# Patient Record
Sex: Female | Born: 1960
Health system: Southern US, Community
[De-identification: ages and names within clinical notes are randomized; demographics above are authoritative.]

## PROBLEM LIST (undated history)

## (undated) DIAGNOSIS — T7840XA Allergy, unspecified, initial encounter: Secondary | ICD-10-CM

## (undated) HISTORY — DX: Allergy, unspecified, initial encounter: T78.40XA

---

## 1999-09-11 ENCOUNTER — Other Ambulatory Visit: Admission: RE | Admit: 1999-09-11 | Discharge: 1999-09-11 | Payer: Self-pay | Admitting: *Deleted

## 1999-11-15 ENCOUNTER — Other Ambulatory Visit: Admission: RE | Admit: 1999-11-15 | Discharge: 1999-11-15 | Payer: Self-pay | Admitting: *Deleted

## 1999-11-15 ENCOUNTER — Encounter (INDEPENDENT_AMBULATORY_CARE_PROVIDER_SITE_OTHER): Payer: Self-pay | Admitting: Specialist

## 2001-02-02 ENCOUNTER — Other Ambulatory Visit: Admission: RE | Admit: 2001-02-02 | Discharge: 2001-02-02 | Payer: Self-pay | Admitting: Gynecology

## 2001-02-02 ENCOUNTER — Other Ambulatory Visit: Admission: RE | Admit: 2001-02-02 | Discharge: 2001-02-02 | Payer: Self-pay | Admitting: Obstetrics and Gynecology

## 2001-09-05 ENCOUNTER — Inpatient Hospital Stay (HOSPITAL_COMMUNITY): Admission: AD | Admit: 2001-09-05 | Discharge: 2001-09-09 | Payer: Self-pay | Admitting: *Deleted

## 2001-09-05 ENCOUNTER — Encounter (INDEPENDENT_AMBULATORY_CARE_PROVIDER_SITE_OTHER): Payer: Self-pay

## 2004-06-05 ENCOUNTER — Other Ambulatory Visit: Admission: RE | Admit: 2004-06-05 | Discharge: 2004-06-05 | Payer: Self-pay | Admitting: Obstetrics and Gynecology

## 2009-02-08 ENCOUNTER — Encounter (INDEPENDENT_AMBULATORY_CARE_PROVIDER_SITE_OTHER): Payer: Self-pay | Admitting: Interventional Radiology

## 2009-02-08 ENCOUNTER — Other Ambulatory Visit: Admission: RE | Admit: 2009-02-08 | Discharge: 2009-02-08 | Payer: Self-pay | Admitting: Interventional Radiology

## 2009-02-08 ENCOUNTER — Encounter: Admission: RE | Admit: 2009-02-08 | Discharge: 2009-02-08 | Payer: Self-pay | Admitting: Internal Medicine

## 2010-07-23 ENCOUNTER — Encounter
Admission: RE | Admit: 2010-07-23 | Discharge: 2010-07-23 | Payer: Self-pay | Source: Home / Self Care | Attending: Obstetrics & Gynecology | Admitting: Obstetrics & Gynecology

## 2010-12-21 NOTE — Discharge Summary (Signed)
[facility]  Patient:    Elizabeth Dennis, Elizabeth Dennis Visit Number: [id number] MRN: [id number]          Service Type: OBS Location: 910A 9146 01 Attending Physician:  Osborn Coho Dictated by:   Miguel Aschoff, M.D. Admit Date:  09/05/2001 Discharge Date: 09/09/2001                             Discharge Summary  ADMISSION DIAGNOSIS:          Intrauterine pregnancy at term.  FINAL DIAGNOSES:              1. Intrauterine pregnancy at term.                               2. Arrest of labor with failure to progress.                               3. Desired permanent sterilization.  OPERATIONS AND PROCEDURES:    1. Primary low flap transverse cesarean section.                               2. Bilateral Pomeroy tubal sterilization.                               3. Epidural anesthesia.  BRIEF HISTORY:                The patient is a 50 year old white female, gravida 2, para 1-0-0-1, with an estimated date of confinement of September 17, 2001. The patient entered into labor and had an arrest of dilatation at 7 cm. HOSPITAL COURSE:              Due to the arrest of labor, she was taken for primary cesarean section and this was carried out without difficulty and yielded a viable female infant weighing 7 pounds 13 ounces. In addition at the time of surgery, the patient underwent a bilateral tubal sterilization. The Apgar score on the infant was 7 at one minute and 9 at five minutes.  The patient had an essentially uncomplicated postoperative course. She tolerated increasing ambulation and diet well. Her hemoglobin did drop to 7.7.  DISCHARGE MEDICATIONS:        1. Tylox one every three hours as needed for                                  pain.                               2. Chromagen iron capsules one daily.  DISCHARGE INSTRUCTIONS:       The patient was instructed to do no heavy lifting and place nothing in the vagina for four weeks.  DISCHARGE FOLLOWUP:            The patient is to return to the office for followup examination in five to six weeks. Dictated by:   Miguel Aschoff, M.D.  Attending Physician:  Osborn Coho DD:  10/09/01 TD:  10/12/01 Job: [id number]

## 2010-12-21 NOTE — Op Note (Signed)
[facility]  Patient:    Elizabeth Dennis, Elizabeth Dennis Visit Number: [id number] MRN: [id number]          Service Type: OBS Location: 910A 9146 01 Attending Physician:  Osborn Coho Dictated by:   Janeece Riggers [name], M.D. Proc. Date: 09/06/01 Admit Date:  09/05/2001                             Operative Report  PREOPERATIVE DIAGNOSES:       1. Intrauterine pregnancy at term.                               2. Failure to progress.                               3. Patient desires permanent sterilization.  POSTOPERATIVE DIAGNOSES:      1. Intrauterine pregnancy at term.                               2. Failure to progress.                               3. Patient desires permanent sterilization.  PROCEDURE:                    1. Primary low transverse cesarean section.                               2. Bilateral tubal ligation, Pomeroy procedure.  SURGEON:                      Elizabeth Dennis, M.D.  ANESTHESIA:                   Epidural.  ANTIBIOTICS:                  Cleocin 900 mg.  COMPLICATIONS:                None.  ESTIMATED BLOOD LOSS:         900 cc.  SPECIMENS:                    Right and left portion of fallopian tube sent to pathology.  FINDINGS:                     Patient had normal fallopian tubes and ovaries bilaterally.  The uterus was noted to have several fibroids.  The largest was approximately 4-5 cm in diameter.  This was on the left fundus.  It was subserosal.  Placenta appeared to be normal.  Was not sent to pathology.  The patient delivered one live viable white female infant weighing 7 pounds and 13 ounces.  INDICATIONS:                  Ms. Elizabeth Dennis is a 50 year old white female G2, P1 who presented to labor and delivery on September 05, 2001 complaining of contractions with spontaneous rupture of membranes.  Patients pregnancy was complicated by advanced maternal age.  She refused an amniocentesis.  Patient also has group B  Strep  with allergy to penicillin.  On admission the patient was 1 cm dilated.  Patient was begun on Pitocin augmentation.  She had a very protracted labor curve.  When she reached 6 cm she had an episode of deep, late decelerations which resolved spontaneously.  The patient progressed to 7 cm and failed to change her cervix over the next three hours.  The patient was taken to the operating room for a primary cesarean section and bilateral tubal ligation.  The patient expressed her understanding of the intended permanence and possible failure rate of this procedure.  PROCEDURE:                     Patient was taken to the operating room where she was placed in the dorsal supine position with a left lateral tilt.  Her epidural anesthetic was adjusted.  Once an adequate level was met she was prepped with Betadine and draped in the usual fashion for this procedure. Pfannenstiel incision was made.  This was carried down to the fascia.  The fascia was entered in the midline, extended laterally.  The rectus muscles were divided from the fascia with the Bovie.  Rectus muscles were divided in the midline and taken superiorly and inferiorly.  The parietoperitoneum was entered bluntly.  The bladder flap was taken down sharply.  A low transverse uterine incision was made in the midline and extended laterally with blunt dissection.  On entering the uterine cavity terminal meconium was noted.  The infant was delivered in the vertex presentation, DeLee and bulb suctioned. The remaining infant was then delivered and cord doubly clamped and cut.  The infant was handed to the awaiting NICU team.  No meconium was noted in blood or cords.  The placenta was then manually removed.  The uterine cavity was wiped with a wet lap.  The uterine incision was closed in a single layer of 0 chromic in a running locking fashion.  The bladder flap was not closed.  The right fallopian tube was then grasped in the isthmic  portion, doubly ligated with 0 gut suture.  A 2 cm knuckle was then excised.  Ostia were visualized. Hemostasis was adequate.  A similar procedure was performed on the opposite side.  At this point the fascia was closed using 0 Monocryl suture in a running fashion.  Subcuticular tissue was made hemostatic with the Bovie. Stainless steel clips were used to close the skin.  Patient tolerated procedure well.  She was taken to recovery room in stable condition. Instrument and lap counts were correct x2. Dictated by:   Janeece Riggers [name], M.D. Attending Physician:  Osborn Coho DD:  09/06/01 TD:  09/07/01 Job: [id number]

## 2014-02-18 ENCOUNTER — Other Ambulatory Visit: Payer: Self-pay | Admitting: Dermatology

## 2015-12-07 DIAGNOSIS — M25522 Pain in left elbow: Secondary | ICD-10-CM | POA: Diagnosis not present

## 2015-12-07 DIAGNOSIS — M67922 Unspecified disorder of synovium and tendon, left upper arm: Secondary | ICD-10-CM | POA: Diagnosis not present

## 2015-12-07 DIAGNOSIS — M7702 Medial epicondylitis, left elbow: Secondary | ICD-10-CM | POA: Diagnosis not present

## 2015-12-07 DIAGNOSIS — M7712 Lateral epicondylitis, left elbow: Secondary | ICD-10-CM | POA: Diagnosis not present

## 2016-01-17 DIAGNOSIS — G8929 Other chronic pain: Secondary | ICD-10-CM | POA: Diagnosis not present

## 2016-01-17 DIAGNOSIS — M25522 Pain in left elbow: Secondary | ICD-10-CM | POA: Diagnosis not present

## 2016-01-23 ENCOUNTER — Encounter: Payer: Self-pay | Admitting: Internal Medicine

## 2016-01-23 ENCOUNTER — Ambulatory Visit (INDEPENDENT_AMBULATORY_CARE_PROVIDER_SITE_OTHER): Payer: 59 | Admitting: Internal Medicine

## 2016-01-23 VITALS — BP 108/78 | HR 97 | Ht 65.0 in | Wt 179.0 lb

## 2016-01-23 DIAGNOSIS — R1310 Dysphagia, unspecified: Secondary | ICD-10-CM

## 2016-01-23 DIAGNOSIS — K219 Gastro-esophageal reflux disease without esophagitis: Secondary | ICD-10-CM | POA: Diagnosis not present

## 2016-01-23 DIAGNOSIS — Z1211 Encounter for screening for malignant neoplasm of colon: Secondary | ICD-10-CM | POA: Diagnosis not present

## 2016-01-23 NOTE — Progress Notes (Signed)
HISTORY OF PRESENT ILLNESS:  Elizabeth Dennis is a 55 y.o. female , cardiology billing employee, who is sent by her primary care physician Dr. Jonni Sanger with chief complaints of GERD, dysphagia, and the need for colon cancer screening. Patient reports having developed discomfort in her elbow for which she took NSAIDs. Thereafter developed pyrosis or regurgitation. She has gained 15 pounds over the past year. She also reports intermittent solid food dysphagia for approximate 1 year. No other abdominal complaints. No family history of colon cancer. She does use alcohol but has never smoked. She is interested in colon cancer screening. Her daughter is a Designer, jewellery  REVIEW OF SYSTEMS:  All non-GI ROS negative upon comprehensive review  History reviewed. No pertinent past medical history.  History reviewed. No pertinent past surgical history.  Social History Elizabeth Dennis  reports that she has never smoked. She has never used smokeless tobacco. She reports that she does not drink alcohol or use illicit drugs.  family history includes Breast cancer in her mother; COPD in her father and mother; Cervical cancer in her mother; Hypertension in her mother.  Allergies  Allergen Reactions  . Penicillins Hives    Childhood - ? Hives       PHYSICAL EXAMINATION: Vital signs: BP 108/78 mmHg  Pulse 97  Ht 5\' 5"  (1.651 m)  Wt 179 lb (81.194 kg)  BMI 29.79 kg/m2  Constitutional: generally well-appearing, no acute distress Psychiatric: alert and oriented x3, cooperative Eyes: extraocular movements intact, anicteric, conjunctiva pink Mouth: oral pharynx moist, no lesions Neck: supple Without thyromegaly Lymph: no lymphadenopathy Cardiovascular: heart regular rate and rhythm, no murmur Lungs: clear to auscultation bilaterally Abdomen: soft, nontender, nondistended, no obvious ascites, no peritoneal signs, normal bowel sounds, no organomegaly Rectal:Deferred until colonoscopy. Extremities: no  clubbing cyanosis or lower extremity edema bilaterally Skin: no lesions on visible extremities Neuro: No focal deficits. Cranial nerves intact. Normal DTRs  ASSESSMENT:  #1. GERD #2. Intermittent solid food dysphagia. Rule out peptic stricture #3. Colon cancer screening. Baseline risk   PLAN:  #1. Reflux precautions with attention to weight loss. Literature provided on GERD and reflux precautions #2. Prilosec OTC 20 mg daily #3. Schedule upper endoscopy with probable esophageal dilation.The nature of the procedure, as well as the risks, benefits, and alternatives were carefully and thoroughly reviewed with the patient. Ample time for discussion and questions allowed. The patient understood, was satisfied, and agreed to proceed. #4. Schedule screening colonoscopy.The nature of the procedure, as well as the risks, benefits, and alternatives were carefully and thoroughly reviewed with the patient. Ample time for discussion and questions allowed. The patient understood, was satisfied, and agreed to proceed.  A copy of this consultation note has been sent to Dr. Jonni Sanger

## 2016-01-23 NOTE — Patient Instructions (Signed)
You may call in the next few months to schedule your colonoscopy  You may take Prilosec over the counter for your reflux symptoms

## 2016-02-14 DIAGNOSIS — M7712 Lateral epicondylitis, left elbow: Secondary | ICD-10-CM | POA: Diagnosis not present

## 2016-02-14 DIAGNOSIS — M7702 Medial epicondylitis, left elbow: Secondary | ICD-10-CM | POA: Diagnosis not present

## 2016-02-14 DIAGNOSIS — M67922 Unspecified disorder of synovium and tendon, left upper arm: Secondary | ICD-10-CM | POA: Diagnosis not present

## 2016-03-22 DIAGNOSIS — D2271 Melanocytic nevi of right lower limb, including hip: Secondary | ICD-10-CM | POA: Diagnosis not present

## 2016-03-22 DIAGNOSIS — L821 Other seborrheic keratosis: Secondary | ICD-10-CM | POA: Diagnosis not present

## 2016-03-22 DIAGNOSIS — Z85828 Personal history of other malignant neoplasm of skin: Secondary | ICD-10-CM | POA: Diagnosis not present

## 2016-03-22 DIAGNOSIS — D225 Melanocytic nevi of trunk: Secondary | ICD-10-CM | POA: Diagnosis not present

## 2016-03-22 DIAGNOSIS — D2371 Other benign neoplasm of skin of right lower limb, including hip: Secondary | ICD-10-CM | POA: Diagnosis not present

## 2016-03-22 DIAGNOSIS — D1801 Hemangioma of skin and subcutaneous tissue: Secondary | ICD-10-CM | POA: Diagnosis not present

## 2016-03-22 DIAGNOSIS — L814 Other melanin hyperpigmentation: Secondary | ICD-10-CM | POA: Diagnosis not present

## 2016-05-31 DIAGNOSIS — Z803 Family history of malignant neoplasm of breast: Secondary | ICD-10-CM | POA: Diagnosis not present

## 2016-05-31 DIAGNOSIS — E669 Obesity, unspecified: Secondary | ICD-10-CM | POA: Diagnosis not present

## 2016-07-04 DIAGNOSIS — R3915 Urgency of urination: Secondary | ICD-10-CM | POA: Diagnosis not present

## 2016-07-04 DIAGNOSIS — R109 Unspecified abdominal pain: Secondary | ICD-10-CM | POA: Diagnosis not present

## 2016-07-04 DIAGNOSIS — R110 Nausea: Secondary | ICD-10-CM | POA: Diagnosis not present

## 2016-07-28 ENCOUNTER — Encounter (HOSPITAL_BASED_OUTPATIENT_CLINIC_OR_DEPARTMENT_OTHER): Payer: Self-pay | Admitting: *Deleted

## 2016-07-28 ENCOUNTER — Emergency Department (HOSPITAL_BASED_OUTPATIENT_CLINIC_OR_DEPARTMENT_OTHER)
Admission: EM | Admit: 2016-07-28 | Discharge: 2016-07-28 | Disposition: A | Discharge status: Home / Self Care | Payer: 59 | Attending: Emergency Medicine | Admitting: Emergency Medicine

## 2016-07-28 DIAGNOSIS — M5441 Lumbago with sciatica, right side: Secondary | ICD-10-CM | POA: Insufficient documentation

## 2016-07-28 DIAGNOSIS — M5431 Sciatica, right side: Secondary | ICD-10-CM | POA: Diagnosis not present

## 2016-07-28 DIAGNOSIS — M545 Low back pain: Secondary | ICD-10-CM | POA: Diagnosis present

## 2016-07-28 MED ORDER — METHOCARBAMOL 500 MG PO TABS: 500.0000 mg | ORAL_TABLET | Freq: Two times a day (BID) | ORAL | 0 refills | Status: DC

## 2016-07-28 MED ORDER — METHOCARBAMOL 500 MG PO TABS
1000.0000 mg | ORAL_TABLET | Freq: Once | ORAL | Status: AC
  Administered 2016-07-28: 1000 mg via ORAL
  Filled 2016-07-28: qty 2

## 2016-07-28 MED ORDER — HYDROCODONE-ACETAMINOPHEN 5-325 MG PO TABS
2.0000 | ORAL_TABLET | Freq: Once | ORAL | Status: AC
  Administered 2016-07-28: 2 via ORAL
  Filled 2016-07-28: qty 2

## 2016-07-28 MED ORDER — HYDROCODONE-ACETAMINOPHEN 5-325 MG PO TABS: 1.0000 | ORAL_TABLET | Freq: Four times a day (QID) | ORAL | 0 refills | Status: DC | PRN

## 2016-07-28 MED ORDER — LIDOCAINE 5 % EX PTCH: 1.0000 | MEDICATED_PATCH | CUTANEOUS | 0 refills | Status: DC

## 2016-07-28 NOTE — ED Triage Notes (Signed)
She was seen at [facility] priority care this afternoon. She was given Prednisone, Toradol and Flexeril. She was diagnosed with sciatica and right leg pain. Here tonight with numbness and tightness in her right leg.

## 2016-07-28 NOTE — ED Provider Notes (Signed)
[facility] DEPT Provider Note   CSN: [id number] Arrival date & time: 07/28/16  2122  By signing my name below, I, Higinio Plan, attest that this documentation has been prepared under the direction and in the presence of non-physician practitioner, Arlean Hopping, PA-C. Electronically Signed: Higinio Plan, Scribe. 07/28/2016. 10:51 PM.  History   Chief Complaint Chief Complaint  Patient presents with  . Leg Pain   The history is provided by the patient. No language interpreter was used.   HPI Comments: Elizabeth Dennis is a 55 y.o. female who presents to the Emergency Department complaining of Pain radiating from her right lower back down her right leg beginning this morning. She reports associated muscle spasms. Patient states that she spent the morning squatting and sitting on the floor playing with her grandchild for a long period of time. Pain is moderate, but becomes severe with the muscle spasms. She was seen at an urgent care facility this afternoon and given prescriptions for methylprednisolone and Flexeril. Patient voices no relief with these medications. Patient also received an injection of Toradol at that time with no relief. Patient denies trauma, neuro deficits, changes in bowel or bladder function, or any other complaints.  History reviewed. No pertinent past medical history.  There are no active problems to display for this patient.  History reviewed. No pertinent surgical history.  OB History    No data available     Home Medications    Prior to Admission medications   Medication Sig Start Date End Date Taking? Authorizing Provider  HYDROcodone-acetaminophen (NORCO/VICODIN) 5-325 MG tablet Take 1-2 tablets by mouth every 6 (six) hours as needed. 07/28/16   Adib Wahba C Victorine Mcnee, PA-C  lidocaine (LIDODERM) 5 % Place 1 patch onto the skin daily. Remove & Discard patch within 12 hours or as directed by MD 07/28/16   Lorayne Bender, PA-C  methocarbamol (ROBAXIN) 500 MG tablet Take 1  tablet (500 mg total) by mouth 2 (two) times daily. 07/28/16   Lakeidra Reliford C Shakeeta Godette, PA-C  PENNSAID 2 % SOLN APPLY 1-2 Pump on skin two times daily 01/06/16   Historical Provider, MD  phentermine 37.5 MG capsule Take by mouth. 01/05/16 02/04/16  Historical Provider, MD  VIMOVO 500-20 MG TBEC Take 1 tablet by mouth 2 (two) times daily. 01/06/16   Historical Provider, MD   Family History Family History  Problem Relation Age of Onset  . Breast cancer Mother   . Cervical cancer Mother   . COPD Mother   . Hypertension Mother   . COPD Father    Social History Social History  Substance Use Topics  . Smoking status: Never Smoker  . Smokeless tobacco: Never Used  . Alcohol use No   Allergies   Penicillins  Review of Systems Review of Systems  Genitourinary: Negative for difficulty urinating.  Musculoskeletal: Positive for back pain and myalgias.  Neurological: Negative for weakness and numbness.   Physical Exam Updated Vital Signs BP 131/79   Pulse 92   Temp 98.2 F (36.8 C) (Oral)   Resp 16   Ht 5\' 5"  (1.651 m)   Wt 170 lb (77.1 kg)   SpO2 97%   BMI 28.29 kg/m   Physical Exam  Constitutional: She appears well-developed and well-nourished. No distress.  HENT:  Head: Normocephalic and atraumatic.  Eyes: Conjunctivae are normal.  Neck: Neck supple.  Cardiovascular: Normal rate, regular rhythm and intact distal pulses.   Pulmonary/Chest: Effort normal.  Musculoskeletal: She exhibits tenderness.  Tenderness to right  lower back into right buttocks as well as tenderness to the right upper leg. Normal motor function intact in all extremities and spine. No midline spinal tenderness.   Neurological: She is alert.  No sensory deficits. Strength 5/5 in both lower extremities. Slow, deliberate gait, but patient is ambulatory without assistance. Coordination intact.   Skin: Skin is warm and dry. She is not diaphoretic.  Psychiatric: She has a normal mood and affect. Her behavior is normal.  Nursing  note and vitals reviewed.  ED Treatments / Results  Labs (all labs ordered are listed, but only abnormal results are displayed) Labs Reviewed - No data to display  EKG  EKG Interpretation None       Radiology No results found.  Procedures Procedures (including critical care time)  Medications Ordered in ED Medications  methocarbamol (ROBAXIN) tablet 1,000 mg (1,000 mg Oral Given 07/28/16 2254)  HYDROcodone-acetaminophen (NORCO/VICODIN) 5-325 MG per tablet 2 tablet (2 tablets Oral Given 07/28/16 2254)    DIAGNOSTIC STUDIES:  Oxygen Saturation is 97% on RA, normal by my interpretation.    COORDINATION OF CARE:  10:43 PM Discussed treatment plan with pt at bedside and pt agreed to plan.  Initial Impression / Assessment and Plan / ED Course  I have reviewed the triage vital signs and the nursing notes.  Pertinent labs & imaging results that were available during my care of the patient were reviewed by me and considered in my medical decision making (see chart for details).  Clinical Course    Patient presents with symptoms of sciatica. No red flag symptoms. No neuro deficits.The patient was given instructions for home care as well as return precautions. Patient voices understanding of these instructions, accepts the plan, and is comfortable with discharge.   I personally performed the services described in this documentation, which was scribed in my presence. The recorded information has been reviewed and is accurate.  Final Clinical Impressions(s) / ED Diagnoses   Final diagnoses:  Sciatica of right side    New Prescriptions Discharge Medication List as of 07/28/2016 11:08 PM    START taking these medications   Details  HYDROcodone-acetaminophen (NORCO/VICODIN) 5-325 MG tablet Take 1-2 tablets by mouth every 6 (six) hours as needed., Starting Sun 07/28/2016, Print    lidocaine (LIDODERM) 5 % Place 1 patch onto the skin daily. Remove & Discard patch within 12  hours or as directed by MD, Starting Sun 07/28/2016, Print    methocarbamol (ROBAXIN) 500 MG tablet Take 1 tablet (500 mg total) by mouth 2 (two) times daily., Starting Sun 07/28/2016, Print         Lorayne Bender, PA-C 07/30/16 0046    Fatima Blank, MD 07/30/16 1154

## 2016-07-28 NOTE — Discharge Instructions (Signed)
Expect your soreness to increase over the next 2-3 days. Take it easy, but do not lay around too much as this may make any stiffness worse. Take 500 mg of naproxen every 12 hours or 800 mg of ibuprofen every 8 hours for the next 3 days. Take these medications with food to avoid upset stomach. Robaxin is a muscle relaxer and may help loosen stiff muscles. Do not take the Robaxin while driving or performing other dangerous activities. Be sure to perform the attached exercises starting with three times a week and working up to performing them daily. This is an essential part of preventing long term problems. Follow up with a primary care provider for any future management of these complaints.

## 2016-07-31 DIAGNOSIS — R300 Dysuria: Secondary | ICD-10-CM | POA: Diagnosis not present

## 2016-07-31 DIAGNOSIS — N3090 Cystitis, unspecified without hematuria: Secondary | ICD-10-CM | POA: Diagnosis not present

## 2016-07-31 DIAGNOSIS — M5431 Sciatica, right side: Secondary | ICD-10-CM | POA: Diagnosis not present

## 2016-07-31 DIAGNOSIS — R531 Weakness: Secondary | ICD-10-CM | POA: Diagnosis not present

## 2016-07-31 DIAGNOSIS — R200 Anesthesia of skin: Secondary | ICD-10-CM | POA: Diagnosis not present

## 2016-08-06 DIAGNOSIS — M5116 Intervertebral disc disorders with radiculopathy, lumbar region: Secondary | ICD-10-CM | POA: Diagnosis not present

## 2016-08-06 DIAGNOSIS — M4727 Other spondylosis with radiculopathy, lumbosacral region: Secondary | ICD-10-CM | POA: Diagnosis not present

## 2016-08-06 DIAGNOSIS — M5117 Intervertebral disc disorders with radiculopathy, lumbosacral region: Secondary | ICD-10-CM | POA: Diagnosis not present

## 2016-08-06 DIAGNOSIS — M5431 Sciatica, right side: Secondary | ICD-10-CM | POA: Diagnosis not present

## 2016-08-06 DIAGNOSIS — M4726 Other spondylosis with radiculopathy, lumbar region: Secondary | ICD-10-CM | POA: Diagnosis not present

## 2016-08-06 DIAGNOSIS — M438X6 Other specified deforming dorsopathies, lumbar region: Secondary | ICD-10-CM | POA: Diagnosis not present

## 2016-08-09 DIAGNOSIS — M79604 Pain in right leg: Secondary | ICD-10-CM | POA: Diagnosis not present

## 2016-08-12 DIAGNOSIS — M79604 Pain in right leg: Secondary | ICD-10-CM | POA: Diagnosis not present

## 2016-08-26 DIAGNOSIS — G5722 Lesion of femoral nerve, left lower limb: Secondary | ICD-10-CM | POA: Diagnosis not present

## 2016-09-09 DIAGNOSIS — G5722 Lesion of femoral nerve, left lower limb: Secondary | ICD-10-CM | POA: Diagnosis not present

## 2016-12-25 MED FILL — AZITHROMYCIN 250 MG TAB: 250 | 5 days supply | Qty: 6 | Fill #0

## 2017-04-01 NOTE — Progress Notes (Signed)
[facility] at [facility] [address], [place], [place] [address] [phone]  Date:  04/03/2017   Name:  Elizabeth Dennis   DOB:  11/11/1960   MRN:  [id number]  PCP:  Darreld Mclean, MD    Chief Complaint: Establish Care   History of Present Illness:  Elizabeth Dennis is a 56 y.o. very pleasant female patient who presents with the following:  Here today as a new patient to establish care She is not new to town- had been a [place] pt Reviewed her last CPE note from October 2016- she had her last pap and mammogram at that time. She is generally in good health  Tdap 08/06/2010   She lives very near to this practice location, and her old doctor left her former Network engineer .  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  She works in billing  She notes that she is overweight, but otherwise besides her leg issues described below she has been generally healthy  She had a bad attack of sciatica over the holidays- she had an MRI, saw neurosurgery- she does not need any operation.  Dr. Ronnald Ramp with [facility] evaluated her   She felt that gabapentin did help, but she did not like the way it made her feel so she stopped taking it   MRI done 08/06/16- 1. Small diffuse disc bulge at L4-L5 eccentric to the left which abuts the exiting left L4 nerve within the distal foramen. Small broad-based left paracentral protrusion at L5-S1 which abuts but does not significantly displace the descending left S1  nerve root. Correlate with radicular symptoms. 2. Mild levocurvature centered at L2-L3. 3. Small T2 hyperintense lesion within the left kidney, T2 hyperintense lesion within liver, these lesions are not fully evaluated, suggest dedicated abdominal ultrasound.  She still has some symptoms in her right leg- it is not usually painful, but can have some pain at times.  The right leg is not  as weak anymore, but is not not 100% strong She has a 56 yo and 56 yo, and a 48yo grandson She is from [place] originally  She notes a family history of breast cancer and cervical cancer There are no active problems to display for this patient.   Past Medical History:  Diagnosis Date  . Allergy     Past Surgical History:  Procedure Laterality Date  . CESAREAN SECTION      Social History  Substance Use Topics  . Smoking status: Never Smoker  . Smokeless tobacco: Never Used  . Alcohol use No    Family History  Problem Relation Age of Onset  . Breast cancer Mother   . Cervical cancer Mother   . COPD Mother   . Hypertension Mother   . COPD Father     Allergies  Allergen Reactions  . Penicillins Hives    Childhood - ? Hives    Medication list has been reviewed and updated.  Current Outpatient Prescriptions on File Prior to Visit  Medication Sig Dispense Refill  . HYDROcodone-acetaminophen (NORCO/VICODIN) 5-325 MG tablet Take 1-2 tablets by mouth every 6 (six) hours as needed. 20 tablet 0  . lidocaine (LIDODERM) 5 % Place 1 patch onto the skin daily. Remove & Discard patch within 12 hours or as directed by MD 30 patch 0  . methocarbamol (ROBAXIN) 500 MG tablet Take 1 tablet (500 mg total) by  mouth 2 (two) times daily. 20 tablet 0  . PENNSAID 2 % SOLN APPLY 1-2 Pump on skin two times daily  1  . VIMOVO 500-20 MG TBEC Take 1 tablet by mouth 2 (two) times daily.  1  . phentermine 37.5 MG capsule Take by mouth.     No current facility-administered medications on file prior to visit.     Review of Systems:  As per HPI- otherwise negative. No fever or chills No CP or SOB No weight loss No cough or ST    Physical Examination: Vitals:   04/03/17 1621  BP: 121/84  Pulse: (!) 102  Temp: 98.4 F (36.9 C)  SpO2: 96%   Vitals:   04/03/17 1621  Weight: 171 lb 12.8 oz (77.9 kg)  Height: 5\' 5"  (1.651 m)   Body mass index is 28.59 kg/m. Ideal Body Weight: Weight in  (lb) to have BMI = 25: 149.9  GEN: WDWN, NAD, Non-toxic, A & O x 3, overweight, looks well HEENT: Atraumatic, Normocephalic. Neck supple. No masses, No LAD. Ears and Nose: No external deformity. CV: RRR, No M/G/R. No JVD. No thrill. No extra heart sounds. PULM: CTA B, no wheezes, crackles, rhonchi. No retractions. No resp. distress. No accessory muscle use. ABD: S, NT, ND, +BS. No rebound. No HSM. EXTR: No c/c/e NEURO Normal gait.  PSYCH: Normally interactive. Conversant. Not depressed or anxious appearing.  Calm demeanor.  She has normal, symmetrical strength in both legs.  Symmetrical patellar DTR. 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  She had an MRI and has seen neurosurgery.  There is unfortunately not much that can be done for her leg, but gladly it is not bothering her as much as it was a few months ago   Signed Lamar Blinks, MD

## 2017-04-03 ENCOUNTER — Encounter: Payer: Self-pay | Admitting: Family Medicine

## 2017-04-03 ENCOUNTER — Ambulatory Visit (INDEPENDENT_AMBULATORY_CARE_PROVIDER_SITE_OTHER): Payer: 59 | Admitting: Family Medicine

## 2017-04-03 VITALS — BP 121/84 | HR 90 | Temp 98.4°F | Ht 65.0 in | Wt 171.8 lb

## 2017-04-03 DIAGNOSIS — Z13220 Encounter for screening for lipoid disorders: Secondary | ICD-10-CM | POA: Diagnosis not present

## 2017-04-03 DIAGNOSIS — Z23 Encounter for immunization: Secondary | ICD-10-CM | POA: Diagnosis not present

## 2017-04-03 DIAGNOSIS — Z130 Encounter for screening for diseases of the blood and blood-forming organs and certain disorders involving the immune mechanism: Secondary | ICD-10-CM | POA: Diagnosis not present

## 2017-04-03 DIAGNOSIS — Z131 Encounter for screening for diabetes mellitus: Secondary | ICD-10-CM

## 2017-04-03 DIAGNOSIS — R202 Paresthesia of skin: Secondary | ICD-10-CM | POA: Diagnosis not present

## 2017-04-03 DIAGNOSIS — Z1159 Encounter for screening for other viral diseases: Secondary | ICD-10-CM

## 2017-04-03 NOTE — Patient Instructions (Addendum)
It was nice to see you today!  Please come and see me for a pap and breast exam at your convenience.   I ordered labs for you; please have fasting blood drawn at your convenience and the results can be sent to me It is also time to update your mammogram!

## 2017-04-13 NOTE — Progress Notes (Signed)
[facility] [address] [place], [place] [address] [phone]  Date:  04/16/2017   Name:  Elizabeth Dennis   DOB:  Jul 15, 1961   MRN:  [id number]  PCP:  Darreld Mclean, MD    Chief Complaint: Annual Exam   History of Present Illness:  Elizabeth Dennis is a 56 y.o. 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  She had an MRI and has seen neurosurgery.  There is unfortunately not much that can be done for her leg, but gladly it is not bothering her as much as it was a few months ago   She is interested in a screening pap today Her last pap was about 2 years ago Never had an abnl  Colon cancer screening: has not done yet.  However she is a pt of [facility] and will schedule with them Flu shot: done already this year  Hep C screening: It appears that mammogram is also due- she will plan to get this done  Patient Active Problem List   Diagnosis Date Noted  . Right leg paresthesias 04/03/2017    Past Medical History:  Diagnosis Date  . Allergy     Past Surgical History:  Procedure Laterality Date  . CESAREAN SECTION      Social History  Substance Use Topics  . Smoking status: Never Smoker  . Smokeless tobacco: Never Used  . Alcohol use No    Family History  Problem Relation Age of Onset  . Breast cancer Mother   . Cervical cancer Mother   . COPD Mother   . Hypertension Mother   . COPD Father     Allergies  Allergen Reactions  . Penicillins Hives    Childhood - ? Hives    Medication list has been reviewed and updated.  No current outpatient prescriptions on file prior to  visit.   No current facility-administered medications on file prior to visit.     Review of Systems:  As per HPI- otherwise negative.   Physical Examination: Vitals:   04/16/17 1504  BP: 119/78  Pulse: 87  Temp: 98.1 F (36.7 C)  SpO2: 99%   Vitals:   04/16/17 1504  Weight: 172 lb (78 kg)  Height: 5\' 5"  (1.651 m)   Body mass index is 28.62 kg/m. Ideal Body Weight: Weight in (lb) to have BMI = 25: 149.9  GEN: WDWN, NAD, Non-toxic, A & O x 3, looks well, overweight HEENT: Atraumatic, Normocephalic. Neck supple. No masses, No LAD.  Bilateral TM wnl, oropharynx normal.  PEERL,EOMI.   Ears and Nose: No external deformity. CV: RRR, No M/G/R. No JVD. No thrill. No extra heart sounds. PULM: CTA B, no wheezes, crackles, rhonchi. No retractions. No resp. distress. No accessory muscle use. ABD: S, NT, ND, +BS. No rebound. No HSM. EXTR: No c/c/e NEURO Normal gait.  PSYCH: Normally interactive. Conversant. Not depressed or anxious appearing.  Calm demeanor.  Breast: normal exam, no masses/ dimpling/ discharge Pelvic: normal, no vaginal lesions or discharge. Uterus normal, no CMT, no adnexal tendereness or masses  Mild tenderness of right lateal epicondylitis on elbow exam. Normal ROM   Assessment and Plan: Physical exam  Screening for cervical cancer - Plan: Cytology - PAP  Here today for a CPE She plans to have fasting labs done at the [facility] location near her home- given a sheet with orders  Await pap Signed Lamar Blinks, MD

## 2017-04-16 ENCOUNTER — Other Ambulatory Visit (HOSPITAL_COMMUNITY)
Admission: RE | Admit: 2017-04-16 | Discharge: 2017-04-16 | Disposition: A | Discharge status: Home / Self Care | Payer: 59 | Source: Ambulatory Visit | Attending: Family Medicine | Admitting: Family Medicine

## 2017-04-16 ENCOUNTER — Other Ambulatory Visit: Payer: Self-pay | Admitting: Family Medicine

## 2017-04-16 ENCOUNTER — Ambulatory Visit (INDEPENDENT_AMBULATORY_CARE_PROVIDER_SITE_OTHER): Payer: 59 | Admitting: Family Medicine

## 2017-04-16 ENCOUNTER — Encounter: Payer: Self-pay | Admitting: Family Medicine

## 2017-04-16 VITALS — BP 119/78 | HR 87 | Temp 98.1°F | Ht 65.0 in | Wt 172.0 lb

## 2017-04-16 DIAGNOSIS — Z124 Encounter for screening for malignant neoplasm of cervix: Secondary | ICD-10-CM

## 2017-04-16 DIAGNOSIS — Z0000 Encounter for general adult medical examination without abnormal findings: Secondary | ICD-10-CM

## 2017-04-16 DIAGNOSIS — Z1231 Encounter for screening mammogram for malignant neoplasm of breast: Secondary | ICD-10-CM

## 2017-04-16 NOTE — Patient Instructions (Signed)
It was very nice to see you today- I will be in touch with your pap asap Please do follow-up with gastroenterology to get your colonoscopy- however if you can't get this done and want to do cologuard that is also ok! Let me know and I will order it for you  If your right elbow continues to bother you I can refer you to a sports med doctor- let me know  Please have fasting labs done at your convenience and I will watch for the results

## 2017-04-18 LAB — CYTOLOGY - PAP
Diagnosis: NEGATIVE
HPV (WINDOPATH): NOT DETECTED

## 2017-04-22 DIAGNOSIS — Z7689 Persons encountering health services in other specified circumstances: Secondary | ICD-10-CM | POA: Diagnosis not present

## 2017-04-22 DIAGNOSIS — Z0000 Encounter for general adult medical examination without abnormal findings: Secondary | ICD-10-CM | POA: Diagnosis not present

## 2017-04-24 ENCOUNTER — Ambulatory Visit (HOSPITAL_BASED_OUTPATIENT_CLINIC_OR_DEPARTMENT_OTHER)
Admission: RE | Admit: 2017-04-24 | Discharge: 2017-04-24 | Disposition: A | Discharge status: Home / Self Care | Payer: 59 | Source: Ambulatory Visit | Attending: Family Medicine | Admitting: Family Medicine

## 2017-04-24 DIAGNOSIS — Z1231 Encounter for screening mammogram for malignant neoplasm of breast: Secondary | ICD-10-CM | POA: Insufficient documentation

## 2017-05-06 DIAGNOSIS — H0231 Blepharochalasis right upper eyelid: Secondary | ICD-10-CM | POA: Diagnosis not present

## 2017-05-06 DIAGNOSIS — H0234 Blepharochalasis left upper eyelid: Secondary | ICD-10-CM | POA: Diagnosis not present

## 2017-06-02 DIAGNOSIS — H524 Presbyopia: Secondary | ICD-10-CM | POA: Diagnosis not present

## 2017-06-02 DIAGNOSIS — H5203 Hypermetropia, bilateral: Secondary | ICD-10-CM | POA: Diagnosis not present

## 2017-07-07 DIAGNOSIS — H02832 Dermatochalasis of right lower eyelid: Secondary | ICD-10-CM | POA: Diagnosis not present

## 2017-07-07 DIAGNOSIS — H02831 Dermatochalasis of right upper eyelid: Secondary | ICD-10-CM | POA: Diagnosis not present

## 2017-07-07 DIAGNOSIS — H02834 Dermatochalasis of left upper eyelid: Secondary | ICD-10-CM | POA: Diagnosis not present

## 2017-07-07 DIAGNOSIS — H02835 Dermatochalasis of left lower eyelid: Secondary | ICD-10-CM | POA: Diagnosis not present

## 2017-07-07 DIAGNOSIS — H2513 Age-related nuclear cataract, bilateral: Secondary | ICD-10-CM | POA: Diagnosis not present

## 2017-08-25 ENCOUNTER — Ambulatory Visit (INDEPENDENT_AMBULATORY_CARE_PROVIDER_SITE_OTHER): Payer: Self-pay | Admitting: Nurse Practitioner

## 2017-08-25 ENCOUNTER — Encounter: Payer: Self-pay | Admitting: Nurse Practitioner

## 2017-08-25 VITALS — BP 104/76 | HR 84 | Temp 98.4°F | Resp 16 | Wt 179.2 lb

## 2017-08-25 DIAGNOSIS — M25552 Pain in left hip: Secondary | ICD-10-CM

## 2017-08-25 MED ORDER — PREDNISONE 10 MG (21) PO TBPK: ORAL_TABLET | ORAL | 0 refills | Status: AC

## 2017-08-25 MED FILL — predniSONE 10 MG (21) TBPK: 10 | 6 days supply | Qty: 21 | Fill #0

## 2017-08-25 NOTE — Progress Notes (Signed)
Subjective:    Elizabeth Dennis is a 57 y.o. female who presents with left hip pain. Onset of the symptoms was 3 days ago. Inciting event: none. The patient reports the hip pain is worse with weight bearing and is aggravated by walking. Aggravating symptoms include: any weight bearing and walking. Patient has had prior hip problems, but right hip prior hip problems. Previous visits for this problem: none. Evaluation to date: none. Treatment to date: OTC analgesics, which have been not very effective and heat. Patient has a history of right hip pain.  The following portions of the patient's history were reviewed and updated as appropriate: allergies, current medications and past medical history.   Review of Systems Constitutional: negative Eyes: negative Ears, nose, mouth, throat, and face: negative Respiratory: negative Cardiovascular: negative Musculoskeletal:positive for left hip pain and left lower back pain, negative for arthralgias   Objective:    There were no vitals taken for this visit. Right hip: positives: decreased abduction, decreased ROM, pain with movement of hip and pain with rotation and negatives: no pain with heel impact pulses full,   Left hip: positives: decreased abduction, decreased ROM, pain with movement of hip and pain with rotation and negatives: no pain with heel impact pulses full   Imaging: X-ray not done  Assessment:    Left Hip Pain w Sciatica    Plan:    Natural history and expected course discussed. Questions answered. Scientist, clinical (histocompatibility and immunogenetics) distributed. Home exercises discussed. OTC analgesics as needed. Patient instructed to use Sterapred Dose Pack. Patient instructed to use ES Tylenol for pain.  Patient to f/u with ortho if symptoms do not improve. Patient verbalized understanding.

## 2017-08-25 NOTE — Patient Instructions (Addendum)
Hip Pain The hip is the joint between the upper legs and the lower pelvis. The bones, cartilage, tendons, and muscles of your hip joint support your body and allow you to move around. Hip pain can range from a minor ache to severe pain in one or both of your hips. The pain may be felt on the inside of the hip joint near the groin, or the outside near the buttocks and upper thigh. You may also have swelling or stiffness. Follow these instructions at home: Managing pain, stiffness, and swelling  If directed, apply ice to the injured area. ? Put ice in a plastic bag. ? Place a towel between your skin and the bag. ? Leave the ice on for 20 minutes, 2-3 times a day  Sleep with a pillow between your legs on your most comfortable side.  Avoid any activities that cause pain. General instructions  Take over-the-counter and prescription medicines only as told by your health care provider.  Do any exercises as told by your health care provider.  Record the following: ? How often you have hip pain. ? The location of your pain. ? What the pain feels like. ? What makes the pain worse.  Keep all follow-up visits as told by your health care provider. This is important. Contact a health care provider if:  You cannot put weight on your leg.  Your pain or swelling continues or gets worse after one week.  It gets harder to walk.  You have a fever. Get help right away if:  You fall.  You have a sudden increase in pain and swelling in your hip.  Your hip is red or swollen or very tender to touch. Summary  Hip pain can range from a minor ache to severe pain in one or both of your hips.  The pain may be felt on the inside of the hip joint near the groin, or the outside near the buttocks and upper thigh.  Avoid any activities that cause pain.  Record how often you have hip pain, the location of the pain, what makes it worse and what it feels like. This information is not intended to  replace advice given to you by your health care provider. Make sure you discuss any questions you have with your health care provider. Document Released: 01/09/2010 Document Revised: 06/24/2016 Document Reviewed: 06/24/2016 Elsevier Interactive Patient Education  2018 [place].  Hip Exercises Ask your health care provider which exercises are safe for you. Do exercises exactly as told by your health care provider and adjust them as directed. It is normal to feel mild stretching, pulling, tightness, or discomfort as you do these exercises, but you should stop right away if you feel sudden pain or your pain gets worse.Do not begin these exercises until told by your health care provider. STRETCHING AND RANGE OF MOTION EXERCISES These exercises warm up your muscles and joints and improve the movement and flexibility of your hip. These exercises also help to relieve pain, numbness, and tingling. Exercise A: Hamstrings, Supine  1. Lie on your back. 2. Loop a belt or towel over the ball of your left / rightfoot. The ball of your foot is on the walking surface, right under your toes. 3. Straighten your left / rightknee and slowly pull on the belt to raise your leg. ? Do not let your left / right knee bend while you do this. ? Keep your other leg flat on the floor. ? Raise the left /  right leg until you feel a gentle stretch behind your left / right knee or thigh. 4. Hold this position for __________ seconds. 5. Slowly return your leg to the starting position. Repeat __________ times. Complete this stretch __________ times a day. Exercise B: Hip Rotators  1. Lie on your back on a firm surface. 2. Hold your left / right knee with your left / right hand. Hold your ankle with your other hand. 3. Gently pull your left / right knee and rotate your lower leg toward your other shoulder. ? Pull until you feel a stretch in your buttocks. ? Keep your hips and shoulders firmly planted while you do this  stretch. 4. Hold this position for __________ seconds. Repeat __________ times. Complete this stretch __________ times a day. Exercise C: V-Sit (Hamstrings and Adductors)  1. Sit on the floor with your legs extended in a large "V" shape. Keep your knees straight during this exercise. 2. Start with your head and chest upright, then bend at your waist to reach for your left foot (position A). You should feel a stretch in your right inner thigh. 3. Hold this position for __________ seconds. Then slowly return to the upright position. 4. Bend at your waist to reach forward (position B). You should feel a stretch behind both of your thighs and knees. 5. Hold this position for __________ seconds. Then slowly return to the upright position. 6. Bend at your waist to reach for your right foot (position C). You should feel a stretch in your left inner thigh. 7. Hold this position for __________ seconds. Then slowly return to the upright position. Repeat __________ times. Complete this stretch __________ times a day. Exercise D: Lunge (Hip Flexors)  1. Place your left / right knee on the floor and bend your other knee so that is directly over your ankle. You should be half-kneeling. 2. Keep good posture with your head over your shoulders. 3. Tighten your buttocks to point your tailbone downward. This helps your back to keep from arching too much. 4. You should feel a gentle stretch in the front of your left / right thigh and hip. If you do not feel any resistance, slightly slide your other foot forward and then slowly lunge forward so your knee once again lines up over your ankle. 5. Make sure your tailbone continues to point downward. 6. Hold this position for __________ seconds. Repeat __________ times. Complete this stretch __________ times a day. STRENGTHENING EXERCISES These exercises build strength and endurance in your hip. Endurance is the ability to use your muscles for a long time, even after  they get tired. Exercise E: Bridge (Hip Extensors)  1. Lie on your back on a firm surface with your knees bent and your feet flat on the floor. 2. Tighten your buttocks muscles and lift your bottom off the floor until the trunk of your body is level with your thighs. ? Do not arch your back. ? You should feel the muscles working in your buttocks and the back of your thighs. If you do not feel these muscles, slide your feet 1-2 inches (2.5-5 cm) farther away from your buttocks. 3. Hold this position for __________ seconds. 4. Slowly lower your hips to the starting position. 5. Let your muscles relax completely between repetitions. 6. If this exercise is too easy, try doing it with your arms crossed over your chest. Repeat __________ times. Complete this exercise __________ times a day. Exercise F: Straight Leg Raises - Hip  Abductors  1. Lie on your side with your left / right leg in the top position. Lie so your head, shoulder, knee, and hip line up with each other. You may bend your bottom knee to help you balance. 2. Roll your hips slightly forward, so your hips are stacked directly over each other and your left / right knee is facing forward. 3. Leading with your heel, lift your top leg 4-6 inches (10-15 cm). You should feel the muscles in your outer hip lifting. ? Do not let your foot drift forward. ? Do not let your knee roll toward the ceiling. 4. Hold this position for __________ seconds. 5. Slowly return to the starting position. 6. Let your muscles relax completely between repetitions. Repeat __________ times. Complete this exercise __________ times a day. Exercise G: Straight Leg Raises - Hip Adductors  1. Lie on your side with your left / right leg in the bottom position. Lie so your head, shoulder, knee, and hip line up. You may place your upper foot in front to help you balance. 2. Roll your hips slightly forward, so your hips are stacked directly over each other and your left /  right knee is facing forward. 3. Tense the muscles in your inner thigh and lift your bottom leg 4-6 inches (10-15 cm). 4. Hold this position for __________ seconds. 5. Slowly return to the starting position. 6. Let your muscles relax completely between repetitions. Repeat __________ times. Complete this exercise __________ times a day. Exercise H: Straight Leg Raises - Quadriceps  1. Lie on your back with your left / right leg extended and your other knee bent. 2. Tense the muscles in the front of your left / right thigh. When you do this, you should see your kneecap slide up or see increased dimpling just above your knee. 3. Tighten these muscles even more and raise your leg 4-6 inches (10-15 cm) off the floor. 4. Hold this position for __________ seconds. 5. Keep these muscles tense as you lower your leg. 6. Relax the muscles slowly and completely between repetitions. Repeat __________ times. Complete this exercise __________ times a day. Exercise I: Hip Abductors, Standing 1. Tie one end of a rubber exercise band or tubing to a secure surface, such as a table or pole. 2. Loop the other end of the band or tubing around your left / right ankle. 3. Keeping your ankle with the band or tubing directly opposite of the secured end, step away until there is tension in the tubing or band. Hold onto a chair as needed for balance. 4. Lift your left / right leg out to your side. While you do this: ? Keep your back upright. ? Keep your shoulders over your hips. ? Keep your toes pointing forward. ? Make sure to use your hip muscles to lift your leg. Do not "throw" your leg or tip your body to lift your leg. 5. Hold this position for __________ seconds. 6. Slowly return to the starting position. Repeat __________ times. Complete this exercise __________ times a day. Exercise J: Squats (Quadriceps) 1. Stand in a door frame so your feet and knees are in line with the frame. You may place your hands on  the frame for balance. 2. Slowly bend your knees and lower your hips like you are going to sit in a chair. ? Keep your lower legs in a straight-up-and-down position. ? Do not let your hips go lower than your knees. ? Do not bend your  knees lower than told by your health care provider. ? If your hip pain increases, do not bend as low. 3. Hold this position for ___________ seconds. 4. Slowly push with your legs to return to standing. Do not use your hands to pull yourself to standing. Repeat __________ times. Complete this exercise __________ times a day. This information is not intended to replace advice given to you by your health care provider. Make sure you discuss any questions you have with your health care provider. Document Released: 08/09/2005 Document Revised: 04/15/2016 Document Reviewed: 07/17/2015 Elsevier Interactive Patient Education  [employer].

## 2017-08-27 ENCOUNTER — Telehealth: Payer: Self-pay | Admitting: Emergency Medicine

## 2017-08-28 DIAGNOSIS — Z6830 Body mass index (BMI) 30.0-30.9, adult: Secondary | ICD-10-CM | POA: Diagnosis not present

## 2017-08-28 DIAGNOSIS — M545 Low back pain: Secondary | ICD-10-CM | POA: Diagnosis not present

## 2017-09-01 MED FILL — METHYLPREDNISOLONE 4 MG TAB: 4 | 6 days supply | Qty: 21 | Fill #0

## 2017-09-26 DIAGNOSIS — H0234 Blepharochalasis left upper eyelid: Secondary | ICD-10-CM | POA: Diagnosis not present

## 2017-09-26 DIAGNOSIS — H0231 Blepharochalasis right upper eyelid: Secondary | ICD-10-CM | POA: Diagnosis not present

## 2017-09-27 ENCOUNTER — Ambulatory Visit (INDEPENDENT_AMBULATORY_CARE_PROVIDER_SITE_OTHER): Payer: Self-pay | Admitting: Nurse Practitioner

## 2017-09-27 ENCOUNTER — Encounter: Payer: Self-pay | Admitting: Nurse Practitioner

## 2017-09-27 VITALS — BP 120/85 | HR 106 | Temp 99.2°F | Resp 16 | Wt 172.2 lb

## 2017-09-27 DIAGNOSIS — J069 Acute upper respiratory infection, unspecified: Secondary | ICD-10-CM

## 2017-09-27 MED ORDER — HYDROCODONE-HOMATROPINE 5-1.5 MG/5ML PO SYRP: 5.0000 mL | ORAL_SOLUTION | Freq: Four times a day (QID) | ORAL | 0 refills | Status: DC | PRN

## 2017-09-27 MED ORDER — AZITHROMYCIN 250 MG PO TABS: ORAL_TABLET | ORAL | 0 refills | Status: DC

## 2017-09-27 NOTE — Progress Notes (Signed)
   Subjective:    Patient ID: Elizabeth Dennis, female    DOB: 31-Dec-1960, 57 y.o.   MRN: [id number]  HPI  Patient comes in today c/o cough , congestion and ears stopped up for over 12 days. The cough has worsened. She had bil blepharoplasty yesterday and the cough is making her surgery area hurt worsen.   Review of Systems  Constitutional: Positive for appetite change (decreased). Negative for chills and fever.  HENT: Positive for congestion, ear pain, sinus pain, sore throat, trouble swallowing and voice change (froggy).   Respiratory: Positive for cough (productive - yellowish green in color). Negative for shortness of breath.   Cardiovascular: Negative.   Genitourinary: Negative.   Musculoskeletal: Negative.   Neurological: Positive for headaches.  Psychiatric/Behavioral: Negative.   All other systems reviewed and are negative.      Objective:   Physical Exam  Constitutional: She is oriented to person, place, and time. She appears well-developed and well-nourished. No distress.  HENT:  Right Ear: Hearing, tympanic membrane, external ear and ear canal normal.  Left Ear: Hearing, tympanic membrane, external ear and ear canal normal.  Nose: Mucosal edema and rhinorrhea present. Right sinus exhibits no maxillary sinus tenderness and no frontal sinus tenderness. Left sinus exhibits no maxillary sinus tenderness and no frontal sinus tenderness.  Mouth/Throat: Uvula is midline. Posterior oropharyngeal erythema (mild) present.  Cardiovascular: Normal rate and regular rhythm.  Pulmonary/Chest: Effort normal and breath sounds normal.  Abdominal: Soft.  Musculoskeletal: Normal range of motion.  Neurological: She is alert and oriented to person, place, and time.  Skin: Skin is warm.  echymosis bil upper lids with wound edges well approximated. Mild edema bil upper lids  Psychiatric: She has a normal mood and affect. Her behavior is normal. Judgment and thought content normal.    BP  120/85 (BP Location: Right Arm, Patient Position: Sitting, Cuff Size: Normal)   Pulse (!) 106   Temp 99.2 F (37.3 C) (Oral)   Resp 16   Wt 172 lb 3.2 oz (78.1 kg)   SpO2 97%   BMI 28.66 kg/m        Assessment & Plan:  1. Upper respiratory infection with cough and congestion 1. Take meds as prescribed 2. Use a cool mist humidifier especially during the winter months and when heat has been humid. 3. Use saline nose sprays frequently 4. Saline irrigations of the nose can be very helpful if done frequently.  * 4X daily for 1 week*  * Use of a nettie pot can be helpful with this. Follow directions with this* 5. Drink plenty of fluids 6. Keep thermostat turn down low 7.For any cough or congestion  Use plain Mucinex- regular strength or max strength is fine   * Children- consult with Pharmacist for dosing 8. For fever or aces or pains- take tylenol or ibuprofen appropriate for age and weight.  * for fevers greater than 101 orally you may alternate ibuprofen and tylenol every  3 hours.    - azithromycin (ZITHROMAX Z-PAK) 250 MG tablet; As directed  Dispense: 6 tablet; Refill: 0 - HYDROcodone-homatropine (HYCODAN) 5-1.5 MG/5ML syrup; Take 5 mLs by mouth every 6 (six) hours as needed for cough.  Dispense: 120 mL; Refill: 0  Mary-Margaret Hassell Done, FNP

## 2017-09-27 NOTE — Patient Instructions (Signed)

## 2017-10-01 ENCOUNTER — Telehealth: Payer: Self-pay

## 2017-10-02 NOTE — Telephone Encounter (Signed)
Patients states she started feeling better but her ear feel clog, I spoke with the provider and he told me to say to the patient to wait a couple of days to wait for her ears to get better, as teh antibiotic she was using it has a long life.

## 2017-10-07 ENCOUNTER — Ambulatory Visit (INDEPENDENT_AMBULATORY_CARE_PROVIDER_SITE_OTHER): Payer: Self-pay | Admitting: Emergency Medicine

## 2017-10-07 VITALS — BP 112/80 | HR 107 | Temp 98.5°F | Resp 18

## 2017-10-07 DIAGNOSIS — H6501 Acute serous otitis media, right ear: Secondary | ICD-10-CM

## 2017-10-07 MED ORDER — PREDNISONE 50 MG PO TABS: ORAL_TABLET | ORAL | 0 refills | Status: DC

## 2017-10-07 NOTE — Progress Notes (Signed)
Subjective:     Elizabeth Dennis is a 57 y.o. female who presents with ear pain and possible ear infection. Symptoms include: right ear pain. Onset of symptoms was 1 week ago, and have been unchanged since that time. Associated symptoms include: feeling of fullness and dullness.  Patient denies: achiness, chills, fever , low grade fever, sinus pressure and sore throat. She is drinking plenty of fluids. She is recovering from a URI diagnosed on 09/27/17    Review of Systems Pertinent items are noted in HPI.   Objective:    BP 112/80 (BP Location: Right Arm, Patient Position: Sitting, Cuff Size: Normal)   Pulse (!) 107   Temp 98.5 F (36.9 C) (Oral)   Resp 18   SpO2 95%  General:  alert, cooperative and appears stated age  Right Ear: TM bulging,fluid clear, no erythema  Left Ear: normal  Mouth:  lips, mucosa, and tongue normal; teeth and gums normal  Neck: no adenopathy     Assessment:    Right acute otitis media   Plan:    Treatment: No antibiotics indicated at this time. OTC analgesia as needed. Fluids, rest, avoid carbonated/alcoholic and caffeinated beverages.  Follow up in 1 week if not improving.

## 2017-10-07 NOTE — Patient Instructions (Addendum)
Otitis Media With Effusion, Pediatric Prednisone, one tablet daily with food, continue the flonase daily along with Zyrtec, follow up here or with your pcp in one week if symptoms continue   Otitis media with effusion (OME) occurs when there is inflammation of the middle ear and fluid in the middle ear space. There are no signs and symptoms of infection. The middle ear space contains air and the bones for hearing. Air in the middle ear space helps to transmit sound to the brain. OME is a common condition in children, and it often occurs after an ear infection. This condition may be present for several weeks or longer after an ear infection. Most cases of this condition get better on their own. What are the causes? OME is caused by a blockage of the eustachian tube in one or both ears. These tubes drain fluid in the ears to the back of the nose (nasopharynx). If the tissue in the tube swells up (edema), the tube closes. This prevents fluid from draining. Blockage can be caused by:  Ear infections.  Colds and other upper respiratory infections.  Allergies.  Irritants, such as tobacco smoke.  Enlarged adenoids. The adenoids are areas of soft tissue located high in the back of the throat, behind the nose and the roof of the mouth. They are part of the body's natural defense (immune) system.  A mass in the nasopharynx.  Damage to the ear caused by pressure changes (barotrauma).  What increases the risk? Your child is more likely to develop this condition if:  He or she has repeated ear and sinus infections.  He or she has allergies.  He or she is exposed to tobacco smoke.  He or she attends daycare.  He or she is not breastfed.  What are the signs or symptoms? Symptoms of this condition may not be obvious. Sometimes this condition does not have any symptoms, or symptoms may overlap with those of a cold or upper respiratory tract illness. Symptoms of this condition  include:  Temporary hearing loss.  A feeling of fullness in the ear without pain.  Irritability or agitation.  Balance (vestibular) problems.  As a result of hearing loss, your child may:  Listen to the TV at a loud volume.  Not respond to questions.  Ask "What?" often when spoken to.  Mistake or confuse one sound or word for another.  Perform poorly at school.  Have a poor attention span.  Become agitated or irritated easily.  How is this diagnosed? This condition is diagnosed with an ear exam. Your child's health care provider will look inside your child's ear with an instrument (otoscope) to check for redness, swelling, and fluid. Other tests may be done, including:  A test to check the movement of the eardrum (pneumatic otoscopy). This is done by squeezing a small amount of air into the ear.  A test that changes air pressure in the middle ear to check how well the eardrum moves and to see if the eustachian tube is working (tympanogram).  Hearing test (audiogram). This test involves playing tones at different pitches to see if your child can hear each tone.  How is this treated? Treatment for this condition depends on the cause. In many cases, the fluid goes away on its own. In some cases, your child may need a procedure to create a hole in the eardrum to allow fluid to drain (myringotomy) and to insert small drainage tubes (tympanostomy tubes) into the eardrums. These tubes  help to drain fluid and prevent infection. This procedure may be recommended if:  OME does not get better over several months.  Your child has many ear infections within several months.  Your child has noticeable hearing loss.  Your child has problems with speech and language development.  Surgery may also be done to remove the adenoids (adenoidectomy). Follow these instructions at home:  Give over-the-counter and prescription medicines only as told by your child's health care  provider.  Keep children away from any tobacco smoke.  Keep all follow-up visits as told by your child's health care provider. This is important. How is this prevented?  Keep your child's vaccinations up to date. Make sure your child gets all recommended vaccinations, including a pneumonia and flu vaccine.  Encourage hand washing. Your child should wash his or her hands often with soap and water. If there is no soap and water, he or she should use hand sanitizer.  Avoid exposing your child to tobacco smoke.  Breastfeed your baby, if possible. Babies who are breastfed as long as possible are less likely to develop this condition. Contact a health care provider if:  Your child's hearing does not get better after 3 months.  Your child's hearing is worse.  Your child has ear pain.  Your child has a fever.  Your child has drainage from the ear.  Your child is dizzy.  Your child has a lump on his or her neck. Get help right away if:  Your child has bleeding from the nose.  Your child cannot move part of her or his face.  Your child has trouble breathing.  Your child cannot smell.  Your child develops severe congestion.  Your child develops weakness.  Your child who is younger than 3 months has a temperature of 100F (38C) or higher. Summary  Otitis media with effusion (OME) occurs when there is inflammation of the middle ear and fluid in the middle ear space.  This condition is caused by blockage of one or both eustachian tubes, which drain fluid in the ears to the back of the nose.  Symptoms of this condition can include temporary hearing loss, a feeling of fullness in the ear, irritability or agitation, and balance (vestibular) problems. Sometimes, there are no symptoms.  This condition is diagnosed with an ear exam and tests, such as pneumatic otoscopy, tympanogram, and audiogram.  Treatment for this condition depends on the cause. In many cases, the fluid goes  away on its own. This information is not intended to replace advice given to you by your health care provider. Make sure you discuss any questions you have with your health care provider. Document Released: 10/12/2003 Document Revised: 06/13/2016 Document Reviewed: 06/13/2016 Elsevier Interactive Patient Education  2017 [employer].

## 2017-10-10 ENCOUNTER — Telehealth: Payer: Self-pay

## 2017-10-16 ENCOUNTER — Telehealth: Payer: Self-pay | Admitting: Nurse Practitioner

## 2017-10-16 NOTE — Telephone Encounter (Signed)
Patient called stating her ears still feel clogged after completing Prednisone 50mg  and Flonase use. Patient's symptoms started on 2/23 with a sinusitis.  Patient has completed the antibiotics given initially on 2/23 and was using Flonase since that time  Patient states she has also been taking Zyrtec without any relief.  Informed patient that she should have felt some relief at this point.  Patient instructed to follow up with ENT d/t other underlying concerns.  Patient verbalized understanding.

## 2017-10-29 DIAGNOSIS — H6591 Unspecified nonsuppurative otitis media, right ear: Secondary | ICD-10-CM | POA: Diagnosis not present

## 2017-10-29 DIAGNOSIS — J019 Acute sinusitis, unspecified: Secondary | ICD-10-CM | POA: Diagnosis not present

## 2017-10-29 MED FILL — CLINDAMYCIN HCL 300 MG CAPS: 300 | 14 days supply | Qty: 42 | Fill #0

## 2018-03-19 NOTE — Telephone Encounter (Signed)
error 

## 2019-05-27 ENCOUNTER — Encounter: Payer: Self-pay | Admitting: Internal Medicine

## 2019-06-07 NOTE — Progress Notes (Addendum)
[place] [facility] [address], [place], [place] [address] [phone]  Date:  06/09/2019   Name:  Elizabeth Dennis   DOB:  Nov 17, 1960   MRN:  [id number]  PCP:  Darreld Mclean, MD    Chief Complaint: Annual Exam   History of Present Illness:  Elizabeth Dennis is a 58 y.o. very pleasant female patient who presents with the following:  Here today for physical exam Last seen by myself about 2 years ago She is generally healthy except for right leg paresthesias-she has been seen by neurosurgery, had an MRI. 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  She is being seen at [facility] Flu shot- done at work  [place] 2018- ordered for her today  Pap up-to-date Menopausal since age 85- warned about PMB Can suggest Shingrix- she is aware  Routine labs due- she is fasting today   She notes more difficulty focusing since they are working from home - it is harder for her to concentrate not going into the office.  She also may sometimes feel tired and unmotivated, she does not feel severely depressed but more dysthymic No suicidal ideation Her son has ADHD and is taking vyvanse  She has used zoloft and lexapro in the past but had some weight gain with these   Wt Readings from Last 3 Encounters:  06/09/19 160 lb (72.6 kg)  09/27/17 172 lb 3.2 oz (78.1 kg)  08/25/17 179 lb 3.2 oz (81.3 kg)   She is working on losing weight- she is down about 20 lbs overall and is working hard Patient Active Problem List   Diagnosis Date Noted  . Right leg paresthesias 04/03/2017    Past Medical History:  Diagnosis Date  . Allergy     Past  Surgical History:  Procedure Laterality Date  . CESAREAN SECTION      Social History   Tobacco Use  . Smoking status: Never Smoker  . Smokeless tobacco: Never Used  Substance Use Topics  . Alcohol use: No    Alcohol/week: 0.0 standard drinks  . Drug use: No    Family History  Problem Relation Age of Onset  . Breast cancer Mother   . Cervical cancer Mother   . COPD Mother   . Hypertension Mother   . COPD Father     Allergies  Allergen Reactions  . Penicillins Hives    Childhood - ? Hives    Medication list has been reviewed and updated.  Current Outpatient Medications on File Prior to Visit  Medication Sig Dispense Refill  . Cetirizine HCl (ZYRTEC ALLERGY PO) Take by mouth.     No current facility-administered medications on file prior to visit.     Review of Systems:  As per HPI- otherwise negative. No fever or chills, no chest pain or shortness of breath  Physical Examination: Vitals:   06/09/19 1038  BP: 102/78  Pulse: 72  Resp: 16  Temp: (!) 97.4 F (36.3 C)  SpO2: 98%   Vitals:   06/09/19 1038  Weight: 160 lb (72.6 kg)  Height: 5\' 5"  (  1.651 m)   Body mass index is 26.63 kg/m. Ideal Body Weight: Weight in (lb) to have BMI = 25: 149.9  GEN: WDWN, NAD, Non-toxic, A & O x 3, mild overweight, looks well HEENT: Atraumatic, Normocephalic. Neck supple. No masses, No LAD.  Bilateral TM wnl, oropharynx normal.  PEERL,EOMI.   Ears and Nose: No external deformity. CV: RRR, No M/G/R. No JVD. No thrill. No extra heart sounds. PULM: CTA B, no wheezes, crackles, rhonchi. No retractions. No resp. distress. No accessory muscle use. ABD: S, NT, ND, +BS. No rebound. No HSM. EXTR: No c/c/e NEURO Normal gait.  PSYCH: Normally interactive. Conversant. Not depressed or anxious appearing.  Calm demeanor.    Assessment and Plan: Physical exam  Screening for diabetes mellitus - Plan: Comprehensive metabolic panel, Hemoglobin A1c  Screening for deficiency  anemia - Plan: CBC  Screening for thyroid disorder - Plan: TSH  Screening for hyperlipidemia - Plan: Lipid panel  Encounter for hepatitis C screening test for low risk patient - Plan: Hepatitis C antibody  Screening for HIV (human immunodeficiency virus) - Plan: HIV Antibody (routine testing w rflx)  Screening for colon cancer  Encounter for screening mammogram for malignant neoplasm of breast - Plan: MM 3D SCREEN BREAST BILATERAL  Depression, major, single episode, mild ([place]) - Plan: buPROPion (WELLBUTRIN SR) 150 MG 12 hr tablet  Here today for complete physical.  Labs pending as above She prefers to do shingles vaccine at a later date Colonoscopy is pending Ordered mammogram Discussed possible ADHD/depression symptoms.  Offered to arrange a formal evaluation for ADHD prior to stimulant use.  At this time she does not wish to be formally evaluated.  In this case we decided to use Wellbutrin which will help to treat dysthymia as well as potentially aid in her concentration  Will plan further follow- up pending labs.   Signed Lamar Blinks, MD  Received her labs, message to patient  Results for orders placed or performed in visit on 06/09/19  CBC  Result Value Ref Range   WBC 4.3 4.0 - 10.5 K/uL   RBC 4.53 3.87 - 5.11 Mil/uL   Platelets 256.0 150.0 - 400.0 K/uL   Hemoglobin 13.8 12.0 - 15.0 g/dL   HCT 41.1 36.0 - 46.0 %   MCV 90.9 78.0 - 100.0 fl   MCHC 33.6 30.0 - 36.0 g/dL   RDW 13.0 11.5 - 15.5 %  Comprehensive metabolic panel  Result Value Ref Range   Sodium 141 135 - 145 mEq/L   Potassium 4.2 3.5 - 5.1 mEq/L   Chloride 106 96 - 112 mEq/L   CO2 28 19 - 32 mEq/L   Glucose, Bld 93 70 - 99 mg/dL   BUN 14 6 - 23 mg/dL   Creatinine, Ser 0.87 0.40 - 1.20 mg/dL   Total Bilirubin 0.5 0.2 - 1.2 mg/dL   Alkaline Phosphatase 63 39 - 117 U/L   AST 15 0 - 37 U/L   ALT 13 0 - 35 U/L   Total Protein 6.8 6.0 - 8.3 g/dL   Albumin 4.3 3.5 - 5.2 g/dL   Calcium 9.1 8.4 - 10.5  mg/dL   GFR 66.74 >60.00 mL/min  Hemoglobin A1c  Result Value Ref Range   Hgb A1c MFr Bld 5.5 4.6 - 6.5 %  Lipid panel  Result Value Ref Range   Cholesterol 222 (H) 0 - 200 mg/dL   Triglycerides 95.0 0.0 - 149.0 mg/dL   HDL 52.80 >39.00 mg/dL   VLDL 19.0  0.0 - 40.0 mg/dL   LDL Cholesterol 150 (H) 0 - 99 mg/dL   Total CHOL/HDL Ratio 4    NonHDL 168.95   HIV Antibody (routine testing w rflx)  Result Value Ref Range   HIV 1&2 Ab, 4th Generation NON-REACTIVE NON-REACTI  Hepatitis C antibody  Result Value Ref Range   Hepatitis C Ab NON-REACTIVE NON-REACTI   SIGNAL TO CUT-OFF 0.01 <1.00  TSH  Result Value Ref Range   TSH 2.75 0.35 - 4.50 uIU/mL

## 2019-06-07 NOTE — Patient Instructions (Signed)
It was nice to see you again today-take care and I will be in touch with your labs ASAP.  Please have your mammogram at your earliest convenience, and I will watch for your colonoscopy report  We can plan to do your shingles vaccine at a later date  I sent in a prescription for Wellbutrin-take once a day for 3 days, then increase to twice a day.  This can help with mood and also aid with ADHD symptoms.  Please let me know how it works for you   Health Maintenance, Female Adopting a healthy lifestyle and getting preventive care are important in promoting health and wellness. Ask your health care provider about:  The right schedule for you to have regular tests and exams.  Things you can do on your own to prevent diseases and keep yourself healthy. What should I know about diet, weight, and exercise? Eat a healthy diet   Eat a diet that includes plenty of vegetables, fruits, low-fat dairy products, and lean protein.  Do not eat a lot of foods that are high in solid fats, added sugars, or sodium. Maintain a healthy weight Body mass index (BMI) is used to identify weight problems. It estimates body fat based on height and weight. Your health care provider can help determine your BMI and help you achieve or maintain a healthy weight. Get regular exercise Get regular exercise. This is one of the most important things you can do for your health. Most adults should:  Exercise for at least 150 minutes each week. The exercise should increase your heart rate and make you sweat (moderate-intensity exercise).  Do strengthening exercises at least twice a week. This is in addition to the moderate-intensity exercise.  Spend less time sitting. Even light physical activity can be beneficial. Watch cholesterol and blood lipids Have your blood tested for lipids and cholesterol at 58 years of age, then have this test every 5 years. Have your cholesterol levels checked more often if:  Your lipid or  cholesterol levels are high.  You are older than 58 years of age.  You are at high risk for heart disease. What should I know about cancer screening? Depending on your health history and family history, you may need to have cancer screening at various ages. This may include screening for:  Breast cancer.  Cervical cancer.  Colorectal cancer.  Skin cancer.  Lung cancer. What should I know about heart disease, diabetes, and high blood pressure? Blood pressure and heart disease  High blood pressure causes heart disease and increases the risk of stroke. This is more likely to develop in people who have high blood pressure readings, are of African descent, or are overweight.  Have your blood pressure checked: ? Every 3-5 years if you are 67-23 years of age. ? Every year if you are 80 years old or older. Diabetes Have regular diabetes screenings. This checks your fasting blood sugar level. Have the screening done:  Once every three years after age 44 if you are at a normal weight and have a low risk for diabetes.  More often and at a younger age if you are overweight or have a high risk for diabetes. What should I know about preventing infection? Hepatitis B If you have a higher risk for hepatitis B, you should be screened for this virus. Talk with your health care provider to find out if you are at risk for hepatitis B infection. Hepatitis C Testing is recommended for:  Everyone born  from [place] through 1965.  Anyone with known risk factors for hepatitis C. Sexually transmitted infections (STIs)  Get screened for STIs, including gonorrhea and chlamydia, if: ? You are sexually active and are younger than 58 years of age. ? You are older than 58 years of age and your health care provider tells you that you are at risk for this type of infection. ? Your sexual activity has changed since you were last screened, and you are at increased risk for chlamydia or gonorrhea. Ask your  health care provider if you are at risk.  Ask your health care provider about whether you are at high risk for HIV. Your health care provider may recommend a prescription medicine to help prevent HIV infection. If you choose to take medicine to prevent HIV, you should first get tested for HIV. You should then be tested every 3 months for as long as you are taking the medicine. Pregnancy  If you are about to stop having your period (premenopausal) and you may become pregnant, seek counseling before you get pregnant.  Take 400 to 800 micrograms (mcg) of folic acid every day if you become pregnant.  Ask for birth control (contraception) if you want to prevent pregnancy. Osteoporosis and menopause Osteoporosis is a disease in which the bones lose minerals and strength with aging. This can result in bone fractures. If you are 49 years old or older, or if you are at risk for osteoporosis and fractures, ask your health care provider if you should:  Be screened for bone loss.  Take a calcium or vitamin D supplement to lower your risk of fractures.  Be given hormone replacement therapy (HRT) to treat symptoms of menopause. Follow these instructions at home: Lifestyle  Do not use any products that contain nicotine or tobacco, such as cigarettes, e-cigarettes, and chewing tobacco. If you need help quitting, ask your health care provider.  Do not use street drugs.  Do not share needles.  Ask your health care provider for help if you need support or information about quitting drugs. Alcohol use  Do not drink alcohol if: ? Your health care provider tells you not to drink. ? You are pregnant, may be pregnant, or are planning to become pregnant.  If you drink alcohol: ? Limit how much you use to 0-1 drink a day. ? Limit intake if you are breastfeeding.  Be aware of how much alcohol is in your drink. In the [place] one drink equals one 12 oz bottle of beer (355 mL), one 5 oz glass of wine (148  mL), or one 1 oz glass of hard liquor (44 mL). General instructions  Schedule regular health, dental, and eye exams.  Stay current with your vaccines.  Tell your health care provider if: ? You often feel depressed. ? You have ever been abused or do not feel safe at home. Summary  Adopting a healthy lifestyle and getting preventive care are important in promoting health and wellness.  Follow your health care provider's instructions about healthy diet, exercising, and getting tested or screened for diseases.  Follow your health care provider's instructions on monitoring your cholesterol and blood pressure. This information is not intended to replace advice given to you by your health care provider. Make sure you discuss any questions you have with your health care provider. Document Released: 02/04/2011 Document Revised: 07/15/2018 Document Reviewed: 07/15/2018 Elsevier Patient Education  2020 [employer].

## 2019-06-09 ENCOUNTER — Other Ambulatory Visit: Payer: Self-pay

## 2019-06-09 ENCOUNTER — Encounter: Payer: Self-pay | Admitting: Family Medicine

## 2019-06-09 ENCOUNTER — Ambulatory Visit (INDEPENDENT_AMBULATORY_CARE_PROVIDER_SITE_OTHER): Payer: 59 | Admitting: Family Medicine

## 2019-06-09 VITALS — BP 102/78 | HR 72 | Temp 97.4°F | Resp 16 | Ht 65.0 in | Wt 160.0 lb

## 2019-06-09 DIAGNOSIS — Z130 Encounter for screening for diseases of the blood and blood-forming organs and certain disorders involving the immune mechanism: Secondary | ICD-10-CM

## 2019-06-09 DIAGNOSIS — Z1231 Encounter for screening mammogram for malignant neoplasm of breast: Secondary | ICD-10-CM | POA: Diagnosis not present

## 2019-06-09 DIAGNOSIS — Z114 Encounter for screening for human immunodeficiency virus [HIV]: Secondary | ICD-10-CM | POA: Diagnosis not present

## 2019-06-09 DIAGNOSIS — Z0000 Encounter for general adult medical examination without abnormal findings: Secondary | ICD-10-CM | POA: Diagnosis not present

## 2019-06-09 DIAGNOSIS — F320 Major depressive disorder, single episode, mild: Secondary | ICD-10-CM

## 2019-06-09 DIAGNOSIS — Z1329 Encounter for screening for other suspected endocrine disorder: Secondary | ICD-10-CM

## 2019-06-09 DIAGNOSIS — Z1211 Encounter for screening for malignant neoplasm of colon: Secondary | ICD-10-CM | POA: Diagnosis not present

## 2019-06-09 DIAGNOSIS — Z131 Encounter for screening for diabetes mellitus: Secondary | ICD-10-CM

## 2019-06-09 DIAGNOSIS — Z1159 Encounter for screening for other viral diseases: Secondary | ICD-10-CM | POA: Diagnosis not present

## 2019-06-09 DIAGNOSIS — Z13220 Encounter for screening for lipoid disorders: Secondary | ICD-10-CM | POA: Diagnosis not present

## 2019-06-09 LAB — COMPREHENSIVE METABOLIC PANEL
ALT: 13 U/L (ref 0–35)
AST: 15 U/L (ref 0–37)
Albumin: 4.3 g/dL (ref 3.5–5.2)
Alkaline Phosphatase: 63 U/L (ref 39–117)
BUN: 14 mg/dL (ref 6–23)
CO2: 28 mEq/L (ref 19–32)
Calcium: 9.1 mg/dL (ref 8.4–10.5)
Chloride: 106 mEq/L (ref 96–112)
Creatinine, Ser: 0.87 mg/dL (ref 0.40–1.20)
GFR: 66.74 mL/min (ref >60.00)
Glucose, Bld: 93 mg/dL (ref 70–99)
Potassium: 4.2 mEq/L (ref 3.5–5.1)
Sodium: 141 mEq/L (ref 135–145)
Total Bilirubin: 0.5 mg/dL (ref 0.2–1.2)
Total Protein: 6.8 g/dL (ref 6.0–8.3)

## 2019-06-09 LAB — CBC
HCT: 41.1 % (ref 36.0–46.0)
Hemoglobin: 13.8 g/dL (ref 12.0–15.0)
MCHC: 33.6 g/dL (ref 30.0–36.0)
MCV: 90.9 fl (ref 78.0–100.0)
Platelets: 256 10*3/uL (ref 150.0–400.0)
RBC: 4.53 Mil/uL (ref 3.87–5.11)
RDW: 13 % (ref 11.5–15.5)
WBC: 4.3 10*3/uL (ref 4.0–10.5)

## 2019-06-09 LAB — LIPID PANEL
Cholesterol: 222 mg/dL — ABNORMAL HIGH (ref 0–200)
HDL: 52.8 mg/dL (ref >39.00)
LDL Cholesterol: 150 mg/dL — ABNORMAL HIGH (ref 0–99)
NonHDL: 168.95
Total CHOL/HDL Ratio: 4
Triglycerides: 95 mg/dL (ref 0.0–149.0)
VLDL: 19 mg/dL (ref 0.0–40.0)

## 2019-06-09 LAB — TSH: TSH: 2.75 u[IU]/mL (ref 0.35–4.50)

## 2019-06-09 LAB — HEMOGLOBIN A1C: Hgb A1c MFr Bld: 5.5 % (ref 4.6–6.5)

## 2019-06-09 MED ORDER — BUPROPION HCL ER (SR) 150 MG PO TB12: 150.0000 mg | ORAL_TABLET | Freq: Two times a day (BID) | ORAL | 6 refills | Status: DC

## 2019-06-09 MED FILL — BUPROPION HCL SR 150 MG TAB: 150 | 30 days supply | Qty: 60 | Fill #0

## 2019-06-10 LAB — HIV ANTIBODY (ROUTINE TESTING W REFLEX): HIV 1&2 Ab, 4th Generation: NONREACTIVE

## 2019-06-10 LAB — HEPATITIS C ANTIBODY
Hepatitis C Ab: NONREACTIVE
SIGNAL TO CUT-OFF: 0.01 (ref <1.00)

## 2019-06-15 DIAGNOSIS — H524 Presbyopia: Secondary | ICD-10-CM | POA: Diagnosis not present

## 2019-06-22 ENCOUNTER — Other Ambulatory Visit: Payer: Self-pay

## 2019-06-22 ENCOUNTER — Encounter (HOSPITAL_BASED_OUTPATIENT_CLINIC_OR_DEPARTMENT_OTHER): Payer: Self-pay

## 2019-06-22 ENCOUNTER — Ambulatory Visit (HOSPITAL_BASED_OUTPATIENT_CLINIC_OR_DEPARTMENT_OTHER)
Admission: RE | Admit: 2019-06-22 | Discharge: 2019-06-22 | Disposition: A | Discharge status: Home / Self Care | Payer: 59 | Source: Ambulatory Visit | Attending: Family Medicine | Admitting: Family Medicine

## 2019-06-22 DIAGNOSIS — Z1231 Encounter for screening mammogram for malignant neoplasm of breast: Secondary | ICD-10-CM | POA: Insufficient documentation

## 2019-06-25 MED FILL — BUPROPION HCL SR 150 MG TAB: 150 | 30 days supply | Qty: 60 | Fill #0

## 2019-07-13 ENCOUNTER — Encounter: Payer: Self-pay | Admitting: Internal Medicine

## 2019-07-13 ENCOUNTER — Ambulatory Visit: Payer: 59 | Admitting: Internal Medicine

## 2019-07-13 VITALS — BP 112/70 | HR 98 | Temp 97.6°F | Ht 65.0 in | Wt 164.0 lb

## 2019-07-13 DIAGNOSIS — Z1159 Encounter for screening for other viral diseases: Secondary | ICD-10-CM

## 2019-07-13 DIAGNOSIS — Z1211 Encounter for screening for malignant neoplasm of colon: Secondary | ICD-10-CM

## 2019-07-13 DIAGNOSIS — R1310 Dysphagia, unspecified: Secondary | ICD-10-CM

## 2019-07-13 DIAGNOSIS — K219 Gastro-esophageal reflux disease without esophagitis: Secondary | ICD-10-CM | POA: Diagnosis not present

## 2019-07-13 MED ORDER — NA SULFATE-K SULFATE-MG SULF 17.5-3.13-1.6 GM/177ML PO SOLN: 1.0000 | Freq: Once | ORAL | 0 refills | Status: AC

## 2019-07-13 MED FILL — SUPREP BOWEL PREP KIT: 17.5-3.13-1 | 2 days supply | Qty: 354 | Fill #0

## 2019-07-13 NOTE — Patient Instructions (Signed)
You have been scheduled for an endoscopy and colonoscopy. Please follow the written instructions given to you at your visit today. Please pick up your prep supplies at the pharmacy within the next 1-3 days. If you use inhalers (even only as needed), please bring them with you on the day of your procedure.  

## 2019-07-13 NOTE — Progress Notes (Signed)
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  She was evaluated January 23, 2016 regarding the same.  She was to have had colonoscopy and upper endoscopy but did not schedule.  The patient tells me that she continues with infrequent GERD symptoms for which she takes PPI once or twice per month.  She does continue with intermittent solid food dysphagia once weekly.  She did go on a diet and lose 20 pounds which she thinks is helped her reflux symptoms.  She has not had colon cancer screening and is interested in screening colonoscopy.  No family history of colon cancer.  No lower GI complaints such as change in bowel habits or bleeding.  Review of outside blood work from June 09, 2019 shows normal comprehensive metabolic panel.  Normal CBC with hemoglobin 13.8.Marland Kitchen  She is on no regular medications except for Wellbutrin  REVIEW OF SYSTEMS:  All non-GI ROS negative unless otherwise stated in the HPI except for headaches, sinus and allergy  Past Medical History:  Diagnosis Date  . Allergy     Past Surgical History:  Procedure Laterality Date  . CESAREAN SECTION      Social History Elizabeth Dennis  reports that she has never smoked. She has never used smokeless tobacco. She reports that she does not drink alcohol or use drugs.  family history includes Breast cancer in her mother; COPD in her father and mother; Cervical cancer in her mother; Hypertension in her mother.  Allergies  Allergen Reactions  . Penicillins Hives    Childhood - ? Hives       PHYSICAL EXAMINATION: Vital signs: BP 112/70   Pulse 98   Temp 97.6 F (36.4 C)   Ht 5\' 5"  (1.651 m)   Wt 164 lb (74.4 kg)   BMI 27.29 kg/m   Constitutional: generally well-appearing, no acute distress Psychiatric: alert and oriented x3, cooperative Eyes: extraocular  movements intact, anicteric, conjunctiva pink Mouth: oral pharynx moist, no lesions Neck: supple no lymphadenopathy Cardiovascular: heart regular rate and rhythm, no murmur Lungs: clear to auscultation bilaterally Abdomen: soft, nontender, nondistended, no obvious ascites, no peritoneal signs, normal bowel sounds, no organomegaly Rectal: Deferred until colonoscopy Extremities: no clubbing, cyanosis, or lower extremity edema bilaterally Skin: no lesions on visible extremities Neuro: No focal deficits.  Cranial nerves intact  ASSESSMENT:  1.  Chronic GERD.  Infrequent symptoms for which she takes on demand PPI 2.  Intermittent solid food dysphagia.  Rule out peptic stricture 3.  Colon cancer screening.  Baseline risk   PLAN:  1.  Reflux precautions 2.  Continue PPI on demand 3.  Schedule upper endoscopy with esophageal dilation.The nature of the procedure, as well as the risks, benefits, and alternatives were carefully and thoroughly reviewed with the patient. Ample time for discussion and questions allowed. The patient understood, was satisfied, and agreed to proceed. 4.  Schedule colonoscopy with polypectomy if needed.The nature of the procedure, as well as the risks, benefits, and alternatives were carefully and thoroughly reviewed with the patient. Ample time for discussion and questions allowed. The patient understood, was satisfied, and agreed to proceed. 5.  She may need PPI therapy more regularly depending upon the findings on endoscopy.

## 2019-09-03 ENCOUNTER — Encounter: Payer: 59 | Admitting: Internal Medicine

## 2019-09-14 NOTE — Telephone Encounter (Signed)
Error

## 2019-09-29 ENCOUNTER — Encounter: Payer: Self-pay | Admitting: Internal Medicine

## 2019-10-06 ENCOUNTER — Telehealth: Payer: Self-pay

## 2019-10-06 ENCOUNTER — Ambulatory Visit (INDEPENDENT_AMBULATORY_CARE_PROVIDER_SITE_OTHER): Payer: 59

## 2019-10-06 ENCOUNTER — Other Ambulatory Visit: Payer: Self-pay | Admitting: Internal Medicine

## 2019-10-06 DIAGNOSIS — Z1159 Encounter for screening for other viral diseases: Secondary | ICD-10-CM | POA: Diagnosis not present

## 2019-10-06 MED FILL — SUPREP BOWEL PREP KIT: 17.5-3.13-1 | 2 days supply | Qty: 354 | Fill #0

## 2019-10-07 LAB — SARS CORONAVIRUS 2 (TAT 6-24 HRS): SARS Coronavirus 2: NEGATIVE

## 2019-10-08 ENCOUNTER — Encounter: Payer: Self-pay | Admitting: Internal Medicine

## 2019-10-08 ENCOUNTER — Other Ambulatory Visit: Payer: Self-pay

## 2019-10-08 ENCOUNTER — Ambulatory Visit (AMBULATORY_SURGERY_CENTER): Payer: 59 | Admitting: Internal Medicine

## 2019-10-08 VITALS — BP 119/72 | HR 93 | Temp 96.6°F | Resp 17 | Ht 65.0 in | Wt 164.0 lb

## 2019-10-08 DIAGNOSIS — K2100 Gastro-esophageal reflux disease with esophagitis, without bleeding: Secondary | ICD-10-CM | POA: Diagnosis not present

## 2019-10-08 DIAGNOSIS — K219 Gastro-esophageal reflux disease without esophagitis: Secondary | ICD-10-CM | POA: Diagnosis not present

## 2019-10-08 DIAGNOSIS — K222 Esophageal obstruction: Secondary | ICD-10-CM

## 2019-10-08 DIAGNOSIS — R1310 Dysphagia, unspecified: Secondary | ICD-10-CM | POA: Diagnosis not present

## 2019-10-08 DIAGNOSIS — Z1211 Encounter for screening for malignant neoplasm of colon: Secondary | ICD-10-CM

## 2019-10-08 DIAGNOSIS — D122 Benign neoplasm of ascending colon: Secondary | ICD-10-CM | POA: Diagnosis not present

## 2019-10-08 MED ORDER — PANTOPRAZOLE SODIUM 40 MG PO TBEC: 40.0000 mg | DELAYED_RELEASE_TABLET | Freq: Every day | ORAL | 11 refills | Status: DC

## 2019-10-08 MED ORDER — SODIUM CHLORIDE 0.9 % IV SOLN: 500.0000 mL | Freq: Once | INTRAVENOUS | Status: DC

## 2019-10-08 MED FILL — PANTOPRAZOLE SOD DR 40 MG T: 40 | 30 days supply | Qty: 30 | Fill #0

## 2019-10-08 NOTE — Progress Notes (Signed)
Elizabeth Dennis

## 2019-10-08 NOTE — Op Note (Signed)
[place] Patient Name: Elizabeth Dennis Procedure Date: 10/08/2019 8:20 AM MRN: [id number] Endoscopist: Docia Chuck. Henrene Pastor , MD Age: 59 Referring MD:  Date of Birth: [birth year] Gender: Female Account #: [id number] Procedure:                Upper GI endoscopy with balloon dilation of the                            esophagus. 20 mm Indications:              Dysphagia, Heartburn Medicines:                Monitored Anesthesia Care Procedure:                Pre-Anesthesia Assessment:                           - Prior to the procedure, a History and Physical                            was performed, and patient medications and                            allergies were reviewed. The patient's tolerance of                            previous anesthesia was also reviewed. The risks                            and benefits of the procedure and the sedation                            options and risks were discussed with the patient.                            All questions were answered, and informed consent                            was obtained. Prior Anticoagulants: The patient has                            taken no previous anticoagulant or antiplatelet                            agents. ASA Grade Assessment: I - A normal, healthy                            patient. After reviewing the risks and benefits,                            the patient was deemed in satisfactory condition to                            undergo the procedure.  After obtaining informed consent, the endoscope was                            passed under direct vision. Throughout the                            procedure, the patient's blood pressure, pulse, and                            oxygen saturations were monitored continuously. The                            Endoscope was introduced through the mouth, and                            advanced to the second part of duodenum. The upper                            GI endoscopy was accomplished without difficulty.                            The patient tolerated the procedure well. Scope In: Scope Out: Findings:                 One benign-appearing, intrinsic, dense ringlike                            moderate stenosis was found 34 cm from the                            incisors. This stenosis measured 1.4 cm (inner                            diameter). There was associated erosive                            esophagitis. After completing the endoscopic survey                            A TTS dilator was passed through the scope.                            Dilation with an 18-19-20 mm balloon dilator was                            performed to 20 mm.                           The exam of the esophagus was otherwise normal. No                            Barrett's.                           The stomach was normal. A 4 cm  hiatal hernia was                            present                           The examined duodenum was normal.                           The cardia and gastric fundus were normal on                            retroflexion. Complications:            No immediate complications. Estimated Blood Loss:     Estimated blood loss: none. Impression:               1. GERD complicated by erosive esophagitis and                            peptic stricture status post dilation                           2. Moderate hiatal hernia                           3. Otherwise normal upper endoscopy. Recommendation:           1. Post dilation diet                           2. Prescribe pantoprazole 40 mg daily; #30; 11                            refills                           3. Office follow-up with Dr. Henrene Pastor in 4 to 6 weeks. Docia Chuck. Henrene Pastor, MD 10/08/2019 9:05:38 AM This report has been signed electronically.

## 2019-10-08 NOTE — Progress Notes (Signed)
pt tolerated well. VSS. awake and to recovery. Report given to RN.  

## 2019-10-08 NOTE — Progress Notes (Signed)
Called to bedside for pt wheezing. Wheezing noted to upper left lobe. Pt vomited approximately 114mL clear secretions. SpO2 at 95% and maintained. Dr. Henrene Pastor updated. Ordered jetneb tx. Improved breath sounds noted. Had minimal clear secretions during procedure. Suctioned for scant amount during procedure.

## 2019-10-08 NOTE — Op Note (Signed)
[place] Patient Name: Hennie Mcclymont Procedure Date: 10/08/2019 8:20 AM MRN: [id number] Endoscopist: Docia Chuck. Henrene Pastor , MD Age: 59 Referring MD:  Date of Birth: 06-08-1961 Gender: Female Account #: [id number] Procedure:                Colonoscopy with cold snare polypectomy x 2 Indications:              Screening for colorectal malignant neoplasm Medicines:                Monitored Anesthesia Care Procedure:                Pre-Anesthesia Assessment:                           - Prior to the procedure, a History and Physical                            was performed, and patient medications and                            allergies were reviewed. The patient's tolerance of                            previous anesthesia was also reviewed. The risks                            and benefits of the procedure and the sedation                            options and risks were discussed with the patient.                            All questions were answered, and informed consent                            was obtained. Prior Anticoagulants: The patient has                            taken no previous anticoagulant or antiplatelet                            agents. ASA Grade Assessment: I - A normal, healthy                            patient. After reviewing the risks and benefits,                            the patient was deemed in satisfactory condition to                            undergo the procedure.                           After obtaining informed consent, the colonoscope  was passed under direct vision. Throughout the                            procedure, the patient's blood pressure, pulse, and                            oxygen saturations were monitored continuously. The                            Colonoscope was introduced through the anus and                            advanced to the the cecum, identified by   appendiceal orifice and ileocecal valve. The                            ileocecal valve, appendiceal orifice, and rectum                            were photographed. The quality of the bowel                            preparation was excellent. The colonoscopy was                            performed without difficulty. The patient tolerated                            the procedure well. The bowel preparation used was                            SUPREP via split dose instruction. Scope In: 8:25:14 AM Scope Out: 8:42:40 AM Scope Withdrawal Time: 0 hours 14 minutes 28 seconds  Total Procedure Duration: 0 hours 17 minutes 26 seconds  Findings:                 Two polyps were found in the ascending colon. The                            polyps were 2 to 4 mm in size. These polyps were                            removed with a cold snare. Resection and retrieval                            (adenomatous appearing polyp retrieved, SSP                            appearing polyp not) were complete.                           Multiple small and large-mouthed diverticula were  found in the entire colon.                           Internal hemorrhoids were found during                            retroflexion. The hemorrhoids were small.                           The exam was otherwise without abnormality on                            direct and retroflexion views. Complications:            No immediate complications. Estimated blood loss:                            None. Estimated Blood Loss:     Estimated blood loss: none. Impression:               - Two 2 to 4 mm polyps in the ascending colon,                            removed with a cold snare. Resected and retrieved.                           - Diverticulosis in the entire examined colon.                           - Internal hemorrhoids.                           - The examination was otherwise normal on direct                             and retroflexion views. Recommendation:           - Repeat colonoscopy in 5 years for surveillance.                           - Patient has a contact number available for                            emergencies. The signs and symptoms of potential                            delayed complications were discussed with the                            patient. Return to normal activities tomorrow.                            Written discharge instructions were provided to the                            patient.                           -  Resume previous diet.                           - Continue present medications.                           - Await pathology results. Docia Chuck. Henrene Pastor, MD 10/08/2019 8:58:19 AM This report has been signed electronically.

## 2019-10-08 NOTE — Patient Instructions (Signed)
YOU HAD AN ENDOSCOPIC PROCEDURE TODAY AT [facility]:   Refer to the procedure report that was given to you for any specific questions about what was found during the examination.  If the procedure report does not answer your questions, please call your gastroenterologist to clarify.  If you requested that your care partner not be given the details of your procedure findings, then the procedure report has been included in a sealed envelope for you to review at your convenience later.  **Handouts given on Esophagitis & stricture, Post dilation diet, Diverticulosis and polyps**   YOU SHOULD EXPECT: Some feelings of bloating in the abdomen. Passage of more gas than usual.  Walking can help get rid of the air that was put into your GI tract during the procedure and reduce the bloating. If you had a lower endoscopy (such as a colonoscopy or flexible sigmoidoscopy) you may notice spotting of blood in your stool or on the toilet paper. If you underwent a bowel prep for your procedure, you may not have a normal bowel movement for a few days.  Please Note:  You might notice some irritation and congestion in your nose or some drainage.  This is from the oxygen used during your procedure.  There is no need for concern and it should clear up in a day or so.  SYMPTOMS TO REPORT IMMEDIATELY:   Following lower endoscopy (colonoscopy or flexible sigmoidoscopy):  Excessive amounts of blood in the stool  Significant tenderness or worsening of abdominal pains  Swelling of the abdomen that is new, acute  Fever of 100F or higher   Following upper endoscopy (EGD)  Vomiting of blood or coffee ground material  New chest pain or pain under the shoulder blades  Painful or persistently difficult swallowing  New shortness of breath  Fever of 100F or higher  Black, tarry-looking stools  For urgent or emergent issues, a gastroenterologist can be reached at any hour by calling [phone]. Do not  use MyChart messaging for urgent concerns.    DIET:  We do recommend a small meal at first, but then you may proceed to your regular diet.  Drink plenty of fluids but you should avoid alcoholic beverages for 24 hours.  ACTIVITY:  You should plan to take it easy for the rest of today and you should NOT DRIVE or use heavy machinery until tomorrow (because of the sedation medicines used during the test).    FOLLOW UP: Our staff will call the number listed on your records 48-72 hours following your procedure to check on you and address any questions or concerns that you may have regarding the information given to you following your procedure. If we do not reach you, we will leave a message.  We will attempt to reach you two times.  During this call, we will ask if you have developed any symptoms of COVID 19. If you develop any symptoms (ie: fever, flu-like symptoms, shortness of breath, cough etc.) before then, please call [phone].  If you test positive for Covid 19 in the 2 weeks post procedure, please call and report this information to [place].    If any biopsies were taken you will be contacted by phone or by letter within the next 1-3 weeks.  Please call us at [phone] if you have not heard about the biopsies in 3 weeks.    SIGNATURES/CONFIDENTIALITY: You and/or your care partner have signed paperwork which will be entered into your electronic medical  record.  These signatures attest to the fact that that the information above on your After Visit Summary has been reviewed and is understood.  Full responsibility of the confidentiality of this discharge information lies with you and/or your care-partner. 

## 2019-10-08 NOTE — Progress Notes (Signed)
Called to room to assist during endoscopic procedure.  Patient ID and intended procedure confirmed with present staff. Received instructions for my participation in the procedure from the performing physician.  

## 2019-10-12 ENCOUNTER — Telehealth: Payer: Self-pay | Admitting: *Deleted

## 2019-10-12 NOTE — Telephone Encounter (Signed)
  Follow up Call-  Call back number 10/08/2019  Post procedure Call Back phone  # [phone]  Permission to leave phone message Yes  Some recent data might be hidden     Patient questions:  Do you have a fever, pain , or abdominal swelling? No. Pain Score  0 *  Have you tolerated food without any problems? Yes.    Have you been able to return to your normal activities? Yes.    Do you have any questions about your discharge instructions: Diet   No. Medications  No. Follow up visit  No.  Do you have questions or concerns about your Care? No.  Actions: * If pain score is 4 or above: No action needed, pain <4.  1. Have you developed a fever since your procedure? no  2.   Have you had an respiratory symptoms (SOB or cough) since your procedure? no  3.   Have you tested positive for COVID 19 since your procedure no  4.   Have you had any family members/close contacts diagnosed with the COVID 19 since your procedure?  no   If yes to any of these questions please route to Joylene John, RN and Alphonsa Gin, Therapist, sports.

## 2019-10-13 ENCOUNTER — Encounter: Payer: Self-pay | Admitting: Internal Medicine

## 2019-10-20 NOTE — Telephone Encounter (Signed)
error 

## 2019-11-23 ENCOUNTER — Other Ambulatory Visit: Payer: Self-pay | Admitting: Internal Medicine

## 2019-11-23 ENCOUNTER — Ambulatory Visit: Payer: 59 | Admitting: Internal Medicine

## 2019-11-23 ENCOUNTER — Encounter: Payer: Self-pay | Admitting: Internal Medicine

## 2019-11-23 VITALS — BP 121/64 | HR 72 | Temp 97.4°F | Ht 65.0 in | Wt 164.0 lb

## 2019-11-23 DIAGNOSIS — K222 Esophageal obstruction: Secondary | ICD-10-CM

## 2019-11-23 DIAGNOSIS — K219 Gastro-esophageal reflux disease without esophagitis: Secondary | ICD-10-CM

## 2019-11-23 DIAGNOSIS — R1310 Dysphagia, unspecified: Secondary | ICD-10-CM | POA: Diagnosis not present

## 2019-11-23 MED ORDER — PANTOPRAZOLE SODIUM 40 MG PO TBEC: 40.0000 mg | DELAYED_RELEASE_TABLET | Freq: Every day | ORAL | 11 refills | Status: DC

## 2019-11-23 MED FILL — PANTOPRAZOLE SOD DR 40 MG T: 40 | 30 days supply | Qty: 30 | Fill #0

## 2019-11-23 NOTE — Progress Notes (Signed)
HISTORY OF PRESENT ILLNESS:  Elizabeth Dennis is a 59 y.o. female, cardiology billing employee, who was evaluated July 13, 2019 regarding screening colonoscopy, persistent dysphagia, and GERD.  See that dictation for details.  She subsequently underwent colonoscopy and upper endoscopy October 08, 2019.  Colonoscopy revealed diminutive adenomatous colon polyps which were removed and diverticulosis.  Internal hemorrhoids as well.  Follow-up in 5 years recommended.  Upper endoscopy revealed erosive esophagitis and distal esophageal stricture.  Stricture was dilated with a balloon to 20 mm.  She was placed on pantoprazole 40 mg daily.  Follow-up at this time requested.  Patient reports that she had that of hoarseness for about 3 weeks, which has resolved.  Patient is pleased to report that her dysphagia has resolved.  As well, antireflux symptoms that she might have been experiencing have abated.  No appreciable medication side effects.  No new complaints.  REVIEW OF SYSTEMS:  All non-GI ROS negative unless otherwise stated in the HPI except for anxiety, night sweats  Past Medical History:  Diagnosis Date  . Allergy     Past Surgical History:  Procedure Laterality Date  . CESAREAN SECTION      Social History Elizabeth Dennis  reports that she has never smoked. She has never used smokeless tobacco. She reports that she does not drink alcohol or use drugs.  family history includes Breast cancer in her mother; COPD in her father and mother; Cervical cancer in her mother; Hypertension in her mother.  Allergies  Allergen Reactions  . Penicillins Hives    Childhood - ? Hives       PHYSICAL EXAMINATION: Vital signs: BP 121/64   Pulse 72   Temp (!) 97.4 F (36.3 C)   Ht 5\' 5"  (1.651 m)   Wt 164 lb (74.4 kg)   BMI 27.29 kg/m   Constitutional: generally well-appearing, no acute distress Psychiatric: alert and oriented x3, cooperative Eyes: Anicteric Abdomen: Not reexamined Skin: No  jaundice Neuro: Grossly intact  ASSESSMENT:  1.  GERD complicated by erosive esophagitis and peptic stricture.  Asymptomatic post dilation on PPI 2.  Adenomatous colon polyps.   PLAN:  1.  Reflux precautions 2.  Continue pantoprazole 40 mg daily.  Multiple prescription refills provided. 3.  Medication risks reviewed 4.  Routine office follow-up 1 year.  Contact the office in the interim for any recurrent dysphagia or other problems. 5.  Surveillance colonoscopy in 5 years

## 2019-11-23 NOTE — Patient Instructions (Signed)
If you are age 59 or older, your body mass index should be between 23-30. Your Body mass index is 27.29 kg/m. If this is out of the aforementioned range listed, please consider follow up with your Primary Care Provider.  If you are age 33 or younger, your body mass index should be between 19-25. Your Body mass index is 27.29 kg/m. If this is out of the aformentioned range listed, please consider follow up with your Primary Care Provider.   We have sent the following medications to your pharmacy for you to pick up at your convenience: Pantoprazole 40 mg  Follow up in one year.  Thank you for choosing me and [facility].  Scarlette Shorts, MD

## 2020-01-24 MED FILL — BUPROPION HCL SR 150 MG TAB: 150 | 30 days supply | Qty: 60 | Fill #1

## 2020-03-07 MED FILL — PANTOPRAZOLE SOD DR 40 MG T: 40 | 30 days supply | Qty: 30 | Fill #1

## 2020-05-18 MED FILL — PANTOPRAZOLE SOD DR 40 MG T: 40 | 30 days supply | Qty: 30 | Fill #2

## 2020-05-18 MED FILL — BUPROPION HCL SR 150 MG TAB: 150 | 30 days supply | Qty: 60 | Fill #2

## 2020-06-28 ENCOUNTER — Encounter: Payer: 59 | Admitting: Family Medicine

## 2020-07-01 NOTE — Patient Instructions (Signed)
It was great to see you again today, I will be in touch with your labs and pap as soon as possible You are good to get a covid booster at your convenience!   Also, we can do your shingles vaccine when you wish I prescribed phentermine 15 mg for you to use daily for short term weight loss Ok to use this for up to 12 weeks- to continue longer we will need to have documentation of weight loss Work on exercise!     Health Maintenance, Female Adopting a healthy lifestyle and getting preventive care are important in promoting health and wellness. Ask your health care provider about:  The right schedule for you to have regular tests and exams.  Things you can do on your own to prevent diseases and keep yourself healthy. What should I know about diet, weight, and exercise? Eat a healthy diet   Eat a diet that includes plenty of vegetables, fruits, low-fat dairy products, and lean protein.  Do not eat a lot of foods that are high in solid fats, added sugars, or sodium. Maintain a healthy weight Body mass index (BMI) is used to identify weight problems. It estimates body fat based on height and weight. Your health care provider can help determine your BMI and help you achieve or maintain a healthy weight. Get regular exercise Get regular exercise. This is one of the most important things you can do for your health. Most adults should:  Exercise for at least 150 minutes each week. The exercise should increase your heart rate and make you sweat (moderate-intensity exercise).  Do strengthening exercises at least twice a week. This is in addition to the moderate-intensity exercise.  Spend less time sitting. Even light physical activity can be beneficial. Watch cholesterol and blood lipids Have your blood tested for lipids and cholesterol at 59 years of age, then have this test every 5 years. Have your cholesterol levels checked more often if:  Your lipid or cholesterol levels are high.  You  are older than 59 years of age.  You are at high risk for heart disease. What should I know about cancer screening? Depending on your health history and family history, you may need to have cancer screening at various ages. This may include screening for:  Breast cancer.  Cervical cancer.  Colorectal cancer.  Skin cancer.  Lung cancer. What should I know about heart disease, diabetes, and high blood pressure? Blood pressure and heart disease  High blood pressure causes heart disease and increases the risk of stroke. This is more likely to develop in people who have high blood pressure readings, are of African descent, or are overweight.  Have your blood pressure checked: ? Every 3-5 years if you are 59-22 years of age. ? Every year if you are 59 years old or older. Diabetes Have regular diabetes screenings. This checks your fasting blood sugar level. Have the screening done:  Once every three years after age 1 if you are at a normal weight and have a low risk for diabetes.  More often and at a younger age if you are overweight or have a high risk for diabetes. What should I know about preventing infection? Hepatitis B If you have a higher risk for hepatitis B, you should be screened for this virus. Talk with your health care provider to find out if you are at risk for hepatitis B infection. Hepatitis C Testing is recommended for:  Everyone born from [birth year] through 1965.  Anyone with known risk factors for hepatitis C. Sexually transmitted infections (STIs)  Get screened for STIs, including gonorrhea and chlamydia, if: ? You are sexually active and are younger than 59 years of age. ? You are older than 59 years of age and your health care provider tells you that you are at risk for this type of infection. ? Your sexual activity has changed since you were last screened, and you are at increased risk for chlamydia or gonorrhea. Ask your health care provider if you are at  risk.  Ask your health care provider about whether you are at high risk for HIV. Your health care provider may recommend a prescription medicine to help prevent HIV infection. If you choose to take medicine to prevent HIV, you should first get tested for HIV. You should then be tested every 3 months for as long as you are taking the medicine. Pregnancy  If you are about to stop having your period (premenopausal) and you may become pregnant, seek counseling before you get pregnant.  Take 400 to 800 micrograms (mcg) of folic acid every day if you become pregnant.  Ask for birth control (contraception) if you want to prevent pregnancy. Osteoporosis and menopause Osteoporosis is a disease in which the bones lose minerals and strength with aging. This can result in bone fractures. If you are 59 years old or older, or if you are at risk for osteoporosis and fractures, ask your health care provider if you should:  Be screened for bone loss.  Take a calcium or vitamin D supplement to lower your risk of fractures.  Be given hormone replacement therapy (HRT) to treat symptoms of menopause. Follow these instructions at home: Lifestyle  Do not use any products that contain nicotine or tobacco, such as cigarettes, e-cigarettes, and chewing tobacco. If you need help quitting, ask your health care provider.  Do not use street drugs.  Do not share needles.  Ask your health care provider for help if you need support or information about quitting drugs. Alcohol use  Do not drink alcohol if: ? Your health care provider tells you not to drink. ? You are pregnant, may be pregnant, or are planning to become pregnant.  If you drink alcohol: ? Limit how much you use to 0-1 drink a day. ? Limit intake if you are breastfeeding.  Be aware of how much alcohol is in your drink. In the [place] one drink equals one 12 oz bottle of beer (355 mL), one 5 oz glass of wine (148 mL), or one 1 oz glass of hard liquor  (44 mL). General instructions  Schedule regular health, dental, and eye exams.  Stay current with your vaccines.  Tell your health care provider if: ? You often feel depressed. ? You have ever been abused or do not feel safe at home. Summary  Adopting a healthy lifestyle and getting preventive care are important in promoting health and wellness.  Follow your health care provider's instructions about healthy diet, exercising, and getting tested or screened for diseases.  Follow your health care provider's instructions on monitoring your cholesterol and blood pressure. This information is not intended to replace advice given to you by your health care provider. Make sure you discuss any questions you have with your health care provider. Document Revised: 07/15/2018 Document Reviewed: 07/15/2018 Elsevier Patient Education  2020 [employer].

## 2020-07-01 NOTE — Progress Notes (Addendum)
[facility] at [facility] [address] [place], [place] [address] [phone]  Date:  07/06/2020   Name:  Elizabeth Dennis   DOB:  03-Oct-1960   MRN:  [id number]  PCP:  Darreld Mclean, MD    Chief Complaint: Annual Exam   History of Present Illness:  Elizabeth Dennis is a 59 y.o. very pleasant female patient who presents with the following:  Patient here today for physical exam Last seen by myself about 1 year ago She is generally healthy except for persistent right leg paresthesias, this is been fully evaluated by neurosurgery including MRI.  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  She works in Orthoptist and coding   Flu vaccine- UTD Pap now due- never had an abnormal, repeat screening today Mammogram 1 year ago- she will do this soon Colon cancer screen up-to-date COVID-19 vaccine- booster now due  Shingles vaccine-she would like to start today Labs 1 year ago  She notes that her father passed away last year, some issues with the will caused conflict between herself and her sister-they have typically been very close but currently not speaking. Elizabeth Dennis notes that her energy is low, she does not feel much like exercising and is overeating.  She has gained a few pounds She is trying to get motivated to work out more.  She may be feeling a bit depressed again but no suicidal ideation.  She would like to continue her current dose of Wellbutrin She is interested in getting her motivation back, would like to try and exercise more   She did use phentermine in the past and did ok with it -she wonders if she could use this again for short period of time  Wt Readings from Last 3 Encounters:  07/06/20 167 lb (75.8 kg)  11/23/19 164 lb (74.4 kg)  10/08/19 164 lb (74.4 kg)     Patient Active Problem List   Diagnosis Date Noted  . Right leg paresthesias 04/03/2017    Past Medical History:  Diagnosis Date  . Allergy     Past Surgical History:  Procedure Laterality Date  . CESAREAN SECTION      Social History   Tobacco Use  . Smoking status: Never Smoker  . Smokeless tobacco: Never Used  Substance Use Topics  . Alcohol use: No    Alcohol/week: 0.0 standard drinks  . Drug use: No    Family History  Problem Relation Age of Onset  . Breast cancer Mother   . Cervical cancer Mother   . COPD Mother   . Hypertension Mother   . COPD Father     Allergies  Allergen Reactions  . Penicillins Hives    Childhood - ? Hives    Medication list has been reviewed and updated.  Current Outpatient Medications on File Prior to Visit  Medication Sig Dispense Refill  . buPROPion (WELLBUTRIN SR) 150 MG 12 hr tablet Take 1 tablet (150 mg total) by mouth 2 (two) times daily. 60 tablet 6  . pantoprazole (PROTONIX) 40 MG tablet Take 1 tablet (40 mg total) by mouth daily. 30 tablet 11   No current facility-administered medications on file prior to visit.    Review of Systems:  As per HPI- otherwise negative.   Physical  Examination: Vitals:   07/06/20 0925  BP: 110/84  Pulse: 82  Resp: 15  SpO2: 98%   Vitals:   07/06/20 0925  Weight: 167 lb (75.8 kg)  Height: 5\' 5"  (1.651 m)   Body mass index is 27.79 kg/m. Ideal Body Weight: Weight in (lb) to have BMI = 25: 149.9  GEN: no acute distress. HEENT: Atraumatic, Normocephalic.  Ears and Nose: No external deformity. CV: RRR, No M/G/R. No JVD. No thrill. No extra heart sounds. PULM: CTA B, no wheezes, crackles, rhonchi. No retractions. No resp. distress. No accessory muscle use. ABD: S, NT, ND, +BS. No rebound. No HSM. EXTR: No c/c/e PSYCH: Normally interactive. Conversant.  Breast: normal exam, no masses/ dimpling/ discharge Pelvic: normal, no vaginal lesions or discharge. Uterus normal, no  CMT, no adnexal tendereness or masses   Assessment and Plan: Physical exam  Screening for diabetes mellitus - Plan: Comprehensive metabolic panel, Hemoglobin A1c  Screening for deficiency anemia - Plan: CBC  Screening for hyperlipidemia - Plan: Lipid panel  Screening for thyroid disorder - Plan: TSH  Fatigue, unspecified type - Plan: TSH, VITAMIN D 25 Hydroxy (Vit-D Deficiency, Fractures)  Screening for cervical cancer - Plan: Cytology - PAP  Overweight - Plan: phentermine 15 MG capsule  Immunization due - Plan: Varicella-zoster vaccine IM (Shingrix)   Patient today for a physical exam Routine labs, Pap pending.  Gave first dose of shingles vaccine Encouraged healthy diet and exercise routine Advised patient she can have a COVID-19 booster at her convenience I did refill phentermine 15 mg daily, advised patient we can use this for short term treatment of the overeating and weight gain.  She will plan to work on return to exercise, will monitor her blood pressure at home and let me know if any increase This visit occurred during the SARS-CoV-2 public health emergency.  Safety protocols were in place, including screening questions prior to the visit, additional usage of staff PPE, and extensive cleaning of exam room while observing appropriate contact time as indicated for disinfecting solutions.    Signed Lamar Blinks, MD   Received her labs as below, message to pt  Results for orders placed or performed in visit on 07/06/20  CBC  Result Value Ref Range   WBC 4.2 4.0 - 10.5 K/uL   RBC 4.65 3.87 - 5.11 Mil/uL   Platelets 261.0 150 - 400 K/uL   Hemoglobin 14.1 12.0 - 15.0 g/dL   HCT 41.6 36 - 46 %   MCV 89.4 78.0 - 100.0 fl   MCHC 33.8 30.0 - 36.0 g/dL   RDW 13.0 11.5 - 15.5 %  Comprehensive metabolic panel  Result Value Ref Range   Sodium 138 135 - 145 mEq/L   Potassium 4.1 3.5 - 5.1 mEq/L   Chloride 101 96 - 112 mEq/L   CO2 30 19 - 32 mEq/L   Glucose, Bld 90 70  - 99 mg/dL   BUN 16 6 - 23 mg/dL   Creatinine, Ser 0.88 0.40 - 1.20 mg/dL   Total Bilirubin 0.9 0.2 - 1.2 mg/dL   Alkaline Phosphatase 71 39 - 117 U/L   AST 17 0 - 37 U/L   ALT 16 0 - 35 U/L   Total Protein 7.1 6.0 - 8.3 g/dL   Albumin 4.5 3.5 - 5.2 g/dL   GFR 71.82 >60.00 mL/min   Calcium 9.6 8.4 - 10.5 mg/dL  Hemoglobin A1c  Result Value Ref Range   Hgb A1c MFr Bld 5.5 4.6 -  6.5 %  Lipid panel  Result Value Ref Range   Cholesterol 237 (H) 0 - 200 mg/dL   Triglycerides 114.0 0 - 149 mg/dL   HDL 54.10 >39.00 mg/dL   VLDL 22.8 0.0 - 40.0 mg/dL   LDL Cholesterol 160 (H) 0 - 99 mg/dL   Total CHOL/HDL Ratio 4    NonHDL 182.98   TSH  Result Value Ref Range   TSH 4.62 (H) 0.35 - 4.50 uIU/mL  VITAMIN D 25 Hydroxy (Vit-D Deficiency, Fractures)  Result Value Ref Range   VITD 29.11 (L) 30.00 - 100.00 ng/mL   The 10-year ASCVD risk score Mikey Bussing DC Jr., et al., 2013) is: 2.6%   Values used to calculate the score:     Age: 70 years     Sex: Female     Is Non-Hispanic African American: No     Diabetic: No     Tobacco smoker: No     Systolic Blood Pressure: 037 mmHg     Is BP treated: No     HDL Cholesterol: 54.1 mg/dL     Total Cholesterol: 237 mg/dL

## 2020-07-06 ENCOUNTER — Encounter: Payer: Self-pay | Admitting: Family Medicine

## 2020-07-06 ENCOUNTER — Other Ambulatory Visit: Payer: Self-pay | Admitting: Family Medicine

## 2020-07-06 ENCOUNTER — Other Ambulatory Visit (HOSPITAL_COMMUNITY)
Admission: RE | Admit: 2020-07-06 | Discharge: 2020-07-06 | Disposition: A | Discharge status: Home / Self Care | Payer: 59 | Source: Ambulatory Visit | Attending: Family Medicine | Admitting: Family Medicine

## 2020-07-06 ENCOUNTER — Ambulatory Visit (INDEPENDENT_AMBULATORY_CARE_PROVIDER_SITE_OTHER): Payer: 59 | Admitting: Family Medicine

## 2020-07-06 ENCOUNTER — Other Ambulatory Visit: Payer: Self-pay

## 2020-07-06 VITALS — BP 110/84 | HR 82 | Resp 15 | Ht 65.0 in | Wt 167.0 lb

## 2020-07-06 DIAGNOSIS — Z1329 Encounter for screening for other suspected endocrine disorder: Secondary | ICD-10-CM

## 2020-07-06 DIAGNOSIS — Z124 Encounter for screening for malignant neoplasm of cervix: Secondary | ICD-10-CM | POA: Insufficient documentation

## 2020-07-06 DIAGNOSIS — R7989 Other specified abnormal findings of blood chemistry: Secondary | ICD-10-CM

## 2020-07-06 DIAGNOSIS — Z131 Encounter for screening for diabetes mellitus: Secondary | ICD-10-CM

## 2020-07-06 DIAGNOSIS — E663 Overweight: Secondary | ICD-10-CM | POA: Diagnosis not present

## 2020-07-06 DIAGNOSIS — R5383 Other fatigue: Secondary | ICD-10-CM | POA: Diagnosis not present

## 2020-07-06 DIAGNOSIS — Z130 Encounter for screening for diseases of the blood and blood-forming organs and certain disorders involving the immune mechanism: Secondary | ICD-10-CM | POA: Diagnosis not present

## 2020-07-06 DIAGNOSIS — Z0000 Encounter for general adult medical examination without abnormal findings: Secondary | ICD-10-CM

## 2020-07-06 DIAGNOSIS — Z13220 Encounter for screening for lipoid disorders: Secondary | ICD-10-CM | POA: Diagnosis not present

## 2020-07-06 DIAGNOSIS — Z23 Encounter for immunization: Secondary | ICD-10-CM | POA: Diagnosis not present

## 2020-07-06 LAB — COMPREHENSIVE METABOLIC PANEL
ALT: 16 U/L (ref 0–35)
AST: 17 U/L (ref 0–37)
Albumin: 4.5 g/dL (ref 3.5–5.2)
Alkaline Phosphatase: 71 U/L (ref 39–117)
BUN: 16 mg/dL (ref 6–23)
CO2: 30 mEq/L (ref 19–32)
Calcium: 9.6 mg/dL (ref 8.4–10.5)
Chloride: 101 mEq/L (ref 96–112)
Creatinine, Ser: 0.88 mg/dL (ref 0.40–1.20)
GFR: 71.82 mL/min (ref >60.00)
Glucose, Bld: 90 mg/dL (ref 70–99)
Potassium: 4.1 mEq/L (ref 3.5–5.1)
Sodium: 138 mEq/L (ref 135–145)
Total Bilirubin: 0.9 mg/dL (ref 0.2–1.2)
Total Protein: 7.1 g/dL (ref 6.0–8.3)

## 2020-07-06 LAB — CBC
HCT: 41.6 % (ref 36.0–46.0)
Hemoglobin: 14.1 g/dL (ref 12.0–15.0)
MCHC: 33.8 g/dL (ref 30.0–36.0)
MCV: 89.4 fl (ref 78.0–100.0)
Platelets: 261 10*3/uL (ref 150.0–400.0)
RBC: 4.65 Mil/uL (ref 3.87–5.11)
RDW: 13 % (ref 11.5–15.5)
WBC: 4.2 10*3/uL (ref 4.0–10.5)

## 2020-07-06 LAB — HEMOGLOBIN A1C: Hgb A1c MFr Bld: 5.5 % (ref 4.6–6.5)

## 2020-07-06 LAB — LIPID PANEL
Cholesterol: 237 mg/dL — ABNORMAL HIGH (ref 0–200)
HDL: 54.1 mg/dL (ref >39.00)
LDL Cholesterol: 160 mg/dL — ABNORMAL HIGH (ref 0–99)
NonHDL: 182.98
Total CHOL/HDL Ratio: 4
Triglycerides: 114 mg/dL (ref 0.0–149.0)
VLDL: 22.8 mg/dL (ref 0.0–40.0)

## 2020-07-06 LAB — VITAMIN D 25 HYDROXY (VIT D DEFICIENCY, FRACTURES): VITD: 29.11 ng/mL — ABNORMAL LOW (ref 30.00–100.00)

## 2020-07-06 LAB — TSH: TSH: 4.62 u[IU]/mL — ABNORMAL HIGH (ref 0.35–4.50)

## 2020-07-06 MED ORDER — PHENTERMINE HCL 15 MG PO CAPS: 15.0000 mg | ORAL_CAPSULE | ORAL | 1 refills | Status: DC

## 2020-07-06 MED FILL — PHENTERMINE HCL 15 MG CAPS: 15 | 30 days supply | Qty: 30 | Fill #0

## 2020-07-06 NOTE — Addendum Note (Signed)
Addended by: Lamar Blinks C on: 07/06/2020 03:10 PM   Modules accepted: Orders

## 2020-07-11 ENCOUNTER — Encounter: Payer: Self-pay | Admitting: Family Medicine

## 2020-07-11 LAB — CYTOLOGY - PAP
Comment: NEGATIVE
Diagnosis: NEGATIVE
High risk HPV: NEGATIVE

## 2020-07-22 MED FILL — PANTOPRAZOLE SOD DR 40 MG T: 40 | 30 days supply | Qty: 30 | Fill #3

## 2020-07-26 MED FILL — PHENTERMINE HCL 15 MG CAPS: 15 | 30 days supply | Qty: 30 | Fill #0

## 2020-08-01 ENCOUNTER — Other Ambulatory Visit (HOSPITAL_BASED_OUTPATIENT_CLINIC_OR_DEPARTMENT_OTHER): Payer: Self-pay | Admitting: Internal Medicine

## 2020-08-01 ENCOUNTER — Ambulatory Visit: Payer: 59 | Attending: Internal Medicine

## 2020-08-01 DIAGNOSIS — Z23 Encounter for immunization: Secondary | ICD-10-CM

## 2020-08-01 MED FILL — PFIZER-BIONTECH COVID-19 VA: 30 | 21 days supply | Qty: 0 | Fill #0

## 2020-08-01 NOTE — Progress Notes (Signed)
   Covid-19 Vaccination Clinic  Name:  GAVIN FAIVRE    MRN: [id number] DOB: 10-03-1960  08/01/2020  Ms. [name] was observed post Covid-19 immunization for 15 minutes without incident. She was provided with Vaccine Information Sheet and instruction to access the V-Safe system.  Vaccinated by Fredirick Maudlin  Ms. [name] was instructed to call 911 with any severe reactions post vaccine: Marland Kitchen Difficulty breathing  . Swelling of face and throat  . A fast heartbeat  . A bad rash all over body  . Dizziness and weakness   Immunizations Administered    Name Date Dose VIS Date Route   Pfizer COVID-19 Vaccine 08/01/2020  9:28 AM 0.3 mL 05/24/2020 Intramuscular   Manufacturer: [employer], [employer]   Lot: [id number]   NDC: [address]-6

## 2020-09-06 ENCOUNTER — Ambulatory Visit: Payer: 59

## 2020-09-13 ENCOUNTER — Other Ambulatory Visit: Payer: Self-pay

## 2020-09-13 ENCOUNTER — Ambulatory Visit (INDEPENDENT_AMBULATORY_CARE_PROVIDER_SITE_OTHER): Payer: No Typology Code available for payment source

## 2020-09-13 ENCOUNTER — Encounter: Payer: Self-pay | Admitting: Family Medicine

## 2020-09-13 ENCOUNTER — Other Ambulatory Visit: Payer: Self-pay | Admitting: Family Medicine

## 2020-09-13 ENCOUNTER — Other Ambulatory Visit (INDEPENDENT_AMBULATORY_CARE_PROVIDER_SITE_OTHER): Payer: No Typology Code available for payment source

## 2020-09-13 DIAGNOSIS — Z23 Encounter for immunization: Secondary | ICD-10-CM | POA: Diagnosis not present

## 2020-09-13 DIAGNOSIS — E039 Hypothyroidism, unspecified: Secondary | ICD-10-CM | POA: Insufficient documentation

## 2020-09-13 DIAGNOSIS — R7989 Other specified abnormal findings of blood chemistry: Secondary | ICD-10-CM | POA: Diagnosis not present

## 2020-09-13 HISTORY — DX: Hypothyroidism, unspecified: E03.9

## 2020-09-13 LAB — TSH: TSH: 5.3 u[IU]/mL — ABNORMAL HIGH (ref 0.35–4.50)

## 2020-09-13 MED ORDER — LEVOTHYROXINE SODIUM 50 MCG PO TABS: 50.0000 ug | ORAL_TABLET | Freq: Every day | ORAL | 3 refills | Status: DC

## 2020-09-13 NOTE — Progress Notes (Signed)
Pt received shot in left deltoid and handled well. Immunization ordered by Dr. Lorelei Pont.

## 2020-10-19 ENCOUNTER — Telehealth: Payer: Self-pay | Admitting: Family Medicine

## 2020-10-19 DIAGNOSIS — E039 Hypothyroidism, unspecified: Secondary | ICD-10-CM

## 2020-10-19 NOTE — Telephone Encounter (Signed)
levothyroxine (SYNTHROID) 50 MCG tablet [[id number]]      [facility] called about patient's medication, Estill Bamberg the pharmacist would like clarification of medication. One has patient ever took this before, if so what dose and name brand did patient take in the past  Call back # [phone] Caller: Estill Bamberg

## 2020-10-20 ENCOUNTER — Encounter: Payer: Self-pay | Admitting: Family Medicine

## 2020-10-20 MED ORDER — LEVOTHYROXINE SODIUM 50 MCG PO TABS: 50.0000 ug | ORAL_TABLET | Freq: Every day | ORAL | 3 refills | Status: DC

## 2020-10-20 NOTE — Telephone Encounter (Signed)
I dont think this is a new medication for this patient? Will you check behind me to clarify if I need to relay any information?

## 2020-10-23 NOTE — Telephone Encounter (Signed)
Spoke with pharmacist. They just needed clarification on quantity to supply. The previous script did not come through correctly.

## 2020-11-10 ENCOUNTER — Other Ambulatory Visit (HOSPITAL_COMMUNITY): Payer: Self-pay

## 2020-11-10 MED ORDER — LEVOTHYROXINE SODIUM 50 MCG PO TABS
50.0000 ug | ORAL_TABLET | Freq: Every day | ORAL | 3 refills | Status: DC
  Filled 2020-11-10: qty 30, 30d supply, fill #0
  Filled 2021-01-10: qty 30, 30d supply, fill #1

## 2020-11-10 MED FILL — Pantoprazole Sodium EC Tab 40 MG (Base Equiv): ORAL | 30 days supply | Qty: 30 | Fill #0 | Status: CN

## 2020-11-10 MED FILL — Phentermine HCl Cap 15 MG: ORAL | 30 days supply | Qty: 30 | Fill #0 | Status: AC

## 2020-11-10 MED FILL — Pantoprazole Sodium EC Tab 40 MG (Base Equiv): ORAL | 30 days supply | Qty: 30 | Fill #0 | Status: AC

## 2020-11-13 ENCOUNTER — Other Ambulatory Visit: Payer: Self-pay

## 2020-11-13 ENCOUNTER — Other Ambulatory Visit (HOSPITAL_COMMUNITY): Payer: Self-pay

## 2020-11-13 ENCOUNTER — Ambulatory Visit (INDEPENDENT_AMBULATORY_CARE_PROVIDER_SITE_OTHER): Payer: No Typology Code available for payment source | Admitting: Family Medicine

## 2020-11-13 ENCOUNTER — Encounter: Payer: Self-pay | Admitting: Family Medicine

## 2020-11-13 VITALS — BP 110/76 | HR 100 | Temp 98.5°F | Resp 18 | Ht 65.0 in | Wt 169.0 lb

## 2020-11-13 DIAGNOSIS — N61 Mastitis without abscess: Secondary | ICD-10-CM | POA: Diagnosis not present

## 2020-11-13 MED ORDER — DOXYCYCLINE HYCLATE 100 MG PO TABS
100.0000 mg | ORAL_TABLET | Freq: Two times a day (BID) | ORAL | 0 refills | Status: DC
  Filled 2020-11-13: qty 20, 10d supply, fill #0

## 2020-11-13 NOTE — Progress Notes (Signed)
Patient ID: Elizabeth Dennis, female    DOB: 09/07/1960  Age: 60 y.o. MRN: [id number]    Subjective:  Subjective  HPI CAIDENCE KASEMAN presents for an office visit today. She complains of right breast pain. She reports that it started on 11/10/2020 and it worsen on 11/11/2020. She notes that she was doing some yard work, when she noticed pain. She endorses having bruising and soreness on the area. She denies any SOB, fever, abdominal pain, cough, chills, sore throat, dysuria, urinary incontinence, back pain, HA, or N/V/D at this time.   Review of Systems  Constitutional: Negative for chills, fatigue and fever.  HENT: Negative for ear pain, sinus pressure, sinus pain and sore throat.   Eyes: Negative for pain.  Respiratory: Negative for cough and shortness of breath.   Cardiovascular: Negative for chest pain, palpitations and leg swelling.  Gastrointestinal: Negative for abdominal pain, blood in stool, constipation, diarrhea, nausea and vomiting.  Genitourinary: Negative for dysuria, frequency, hematuria and urgency.  Musculoskeletal: Negative for back pain.       (+) right breast pain  (+) right breast soreness secondary to breast pain   Skin: Positive for color change (Bruising on right breast ).  Neurological: Negative for dizziness and headaches.    History Past Medical History:  Diagnosis Date  . Acquired hypothyroidism 09/13/2020  . Allergy     She has a past surgical history that includes Cesarean section.   Her family history includes Breast cancer in her mother; COPD in her father and mother; Cervical cancer in her mother; Hypertension in her mother.She reports that she has never smoked. She has never used smokeless tobacco. She reports that she does not drink alcohol and does not use drugs.  Current Outpatient Medications on File Prior to Visit  Medication Sig Dispense Refill  . buPROPion (WELLBUTRIN SR) 150 MG 12 hr tablet Take 1 tablet (150 mg total) by mouth 2 (two) times  daily. 60 tablet 6  . COVID-19 mRNA vaccine, Pfizer, 30 MCG/0.3ML injection INJECT AS DIRECTED .3 mL 0  . levothyroxine (SYNTHROID) 50 MCG tablet Take 1 tablet (50 mcg total) by mouth daily. 30 tablet 3  . levothyroxine (SYNTHROID) 50 MCG tablet Take 1 tablet (50 mcg total) by mouth daily. 30 tablet 3  . pantoprazole (PROTONIX) 40 MG tablet TAKE 1 TABLET BY MOUTH DAILY 30 tablet 11  . phentermine 15 MG capsule TAKE 1 CAPSULE BY MOUTH EVERY MORNING 30 capsule 1   No current facility-administered medications on file prior to visit.     Objective:  Objective  Physical Exam Vitals and nursing note reviewed.  Constitutional:      General: She is not in acute distress.    Appearance: Normal appearance. She is well-developed. She is not ill-appearing.  HENT:     Head: Normocephalic and atraumatic.     Right Ear: External ear normal.     Left Ear: External ear normal.     Nose: Nose normal.  Eyes:     Extraocular Movements: Extraocular movements intact.     Pupils: Pupils are equal, round, and reactive to light.  Cardiovascular:     Rate and Rhythm: Normal rate and regular rhythm.     Pulses: Normal pulses.     Heart sounds: Normal heart sounds. No murmur heard. No friction rub. No gallop.   Pulmonary:     Effort: Pulmonary effort is normal. No respiratory distress.     Breath sounds: Normal breath sounds. No stridor. No  wheezing, rhonchi or rales.  Chest:  Breasts:     Right: Tenderness present.      Comments: There are erythema and tenderness present to on the  right lateral breast. The area is hot to touch.  Abdominal:     General: Bowel sounds are normal. There is no distension.     Palpations: Abdomen is soft.     Tenderness: There is no abdominal tenderness. There is no guarding or rebound.     Hernia: No hernia is present.  Musculoskeletal:        General: Normal range of motion.     Cervical back: Normal range of motion and neck supple.  Skin:    General: Skin is warm  and dry.  Neurological:     Mental Status: She is alert and oriented to person, place, and time.  Psychiatric:        Behavior: Behavior normal.        Thought Content: Thought content normal.    There were no vitals taken for this visit. Wt Readings from Last 3 Encounters:  07/06/20 167 lb (75.8 kg)  11/23/19 164 lb (74.4 kg)  10/08/19 164 lb (74.4 kg)     Lab Results  Component Value Date   WBC 4.2 07/06/2020   HGB 14.1 07/06/2020   HCT 41.6 07/06/2020   PLT 261.0 07/06/2020   GLUCOSE 90 07/06/2020   CHOL 237 (H) 07/06/2020   TRIG 114.0 07/06/2020   HDL 54.10 07/06/2020   LDLCALC 160 (H) 07/06/2020   ALT 16 07/06/2020   AST 17 07/06/2020   NA 138 07/06/2020   K 4.1 07/06/2020   CL 101 07/06/2020   CREATININE 0.88 07/06/2020   BUN 16 07/06/2020   CO2 30 07/06/2020   TSH 5.30 (H) 09/13/2020   HGBA1C 5.5 07/06/2020    No results found.   Assessment & Plan:  Plan    No orders of the defined types were placed in this encounter.   Problem List Items Addressed This Visit   None     Follow-up: No follow-ups on file.   I,Gordon Zheng,acting as a Education administrator for [employer], DO.,have documented all relevant documentation on the behalf of Ann Held, DO,as directed by  Ann Held, DO while in the presence of [place], DO, have reviewed all documentation for this visit. The documentation on 11/13/20 for the exam, diagnosis, procedures, and orders are all accurate and complete.

## 2020-11-13 NOTE — Assessment & Plan Note (Signed)
abx per orders Diagnostic mammogram ordered and [place]  rto if redness does not improve over next few days

## 2020-11-13 NOTE — Patient Instructions (Signed)

## 2020-11-14 ENCOUNTER — Other Ambulatory Visit: Payer: Self-pay

## 2020-11-14 ENCOUNTER — Encounter: Payer: Self-pay | Admitting: Family Medicine

## 2020-11-14 DIAGNOSIS — N61 Mastitis without abscess: Secondary | ICD-10-CM

## 2020-11-15 ENCOUNTER — Ambulatory Visit
Admission: RE | Admit: 2020-11-15 | Discharge: 2020-11-15 | Disposition: A | Discharge status: Home / Self Care | Payer: No Typology Code available for payment source | Source: Ambulatory Visit | Attending: Family Medicine | Admitting: Family Medicine

## 2020-11-15 ENCOUNTER — Ambulatory Visit: Payer: No Typology Code available for payment source

## 2020-11-15 ENCOUNTER — Other Ambulatory Visit: Payer: Self-pay | Admitting: Family Medicine

## 2020-11-15 DIAGNOSIS — N61 Mastitis without abscess: Secondary | ICD-10-CM

## 2020-11-15 DIAGNOSIS — N611 Abscess of the breast and nipple: Secondary | ICD-10-CM

## 2020-11-16 ENCOUNTER — Other Ambulatory Visit (HOSPITAL_COMMUNITY): Payer: Self-pay

## 2020-12-06 ENCOUNTER — Other Ambulatory Visit: Payer: No Typology Code available for payment source

## 2021-01-10 ENCOUNTER — Other Ambulatory Visit: Payer: Self-pay

## 2021-01-10 ENCOUNTER — Encounter: Payer: Self-pay | Admitting: Family Medicine

## 2021-01-10 ENCOUNTER — Other Ambulatory Visit: Payer: Self-pay | Admitting: Internal Medicine

## 2021-01-10 ENCOUNTER — Other Ambulatory Visit (HOSPITAL_COMMUNITY): Payer: Self-pay

## 2021-01-10 ENCOUNTER — Ambulatory Visit (INDEPENDENT_AMBULATORY_CARE_PROVIDER_SITE_OTHER): Payer: No Typology Code available for payment source | Admitting: Family Medicine

## 2021-01-10 VITALS — BP 120/76 | HR 83 | Temp 97.6°F | Resp 16 | Ht 65.0 in | Wt 164.0 lb

## 2021-01-10 DIAGNOSIS — M4124 Other idiopathic scoliosis, thoracic region: Secondary | ICD-10-CM

## 2021-01-10 DIAGNOSIS — M25521 Pain in right elbow: Secondary | ICD-10-CM

## 2021-01-10 MED ORDER — CYCLOBENZAPRINE HCL 10 MG PO TABS
10.0000 mg | ORAL_TABLET | Freq: Two times a day (BID) | ORAL | 0 refills | Status: DC | PRN
  Filled 2021-01-10: qty 30, 15d supply, fill #0

## 2021-01-10 MED ORDER — MELOXICAM 7.5 MG PO TABS
7.5000 mg | ORAL_TABLET | Freq: Every day | ORAL | 0 refills | Status: DC
  Filled 2021-01-10: qty 30, 30d supply, fill #0

## 2021-01-10 NOTE — Progress Notes (Signed)
[facility] at [facility] [place], [place], [place] [address] [phone] [id number]  Date:  01/10/2021   Name:  Elizabeth Dennis   DOB:  10/25/1960   MRN:  [id number]  PCP:  Darreld Mclean, MD    Chief Complaint: Tendonitis (Right elbow pain, neck radiating down)   History of Present Illness:  Elizabeth Dennis is a 60 y.o. 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 Cellulitis of right breast 11/13/2020  . Acquired hypothyroidism 09/13/2020  . Right leg paresthesias 04/03/2017    Past Medical History:  Diagnosis Date  . Acquired hypothyroidism 09/13/2020  . Allergy     Past Surgical History:  Procedure Laterality Date  . CESAREAN SECTION      Social History   Tobacco Use  . Smoking status: Never Smoker  . Smokeless tobacco: Never Used  Substance Use Topics  . Alcohol use: No    Alcohol/week: 0.0 standard drinks  . Drug use: No    Family History  Problem Relation Age of Onset  . Breast cancer Mother   . Cervical cancer Mother   . COPD Mother   . Hypertension Mother   . COPD Father     Allergies  Allergen Reactions  . Penicillins Hives    Childhood - ? Hives    Medication list has been reviewed and updated.  Current Outpatient Medications on File Prior  to Visit  Medication Sig Dispense Refill  . buPROPion (WELLBUTRIN SR) 150 MG 12 hr tablet Take 1 tablet (150 mg total) by mouth 2 (two) times daily. 60 tablet 6  . levothyroxine (SYNTHROID) 50 MCG tablet Take 1 tablet (50 mcg total) by mouth daily. 30 tablet 3  . pantoprazole (PROTONIX) 40 MG tablet TAKE 1 TABLET BY MOUTH DAILY 30 tablet 11  . phentermine 15 MG capsule TAKE 1 CAPSULE BY MOUTH EVERY MORNING 30 capsule 1   No current facility-administered medications on file prior to visit.    Review of Systems:  As per HPI- otherwise negative.   Physical Examination: Vitals:   01/10/21 1100  BP: 120/76  Pulse: 83  Resp: 16  Temp: 97.6 F (36.4 C)  SpO2: 97%   Vitals:   01/10/21 1100  Weight: 164 lb (74.4 kg)  Height: 5\' 5"  (1.651 m)   Body mass index is 27.29 kg/m. Ideal Body Weight: Weight in (lb) to have BMI = 25: 149.9  GEN: no acute distress.  Overweight, looks well HEENT: Atraumatic, Normocephalic.  Ears and Nose: No external deformity. CV: RRR, No M/G/R. No JVD. No thrill. No extra heart sounds. PULM: CTA B, no wheezes, crackles, rhonchi. No retractions. No resp. distress. No accessory muscle use. EXTR:  No c/c/e PSYCH: Normally interactive. Conversant.  Thoracic scoliosis is present. She has tenderness over the lateral epicondyle of her right arm, typical of lateral epicondylitis.  No pain however with resisted supination or pronation, full range of motion of her elbow.  Normal strength of both upper extremities. Normal cervical range of motion  Assessment and Plan: Right elbow pain - Plan: meloxicam (MOBIC) 7.5 MG tablet, cyclobenzaprine (FLEXERIL) 10 MG tablet, Ambulatory referral to Orthopedic Surgery  Other idiopathic scoliosis, thoracic region  Patient today with concern of elbow pain, seems most consistent with lateral epicondylitis.  She is already using a tennis elbow strap.  I prescribed meloxicam for her to use as needed, also Flexeril to use for any  related neck pain-caution regarding sedation  We discussed getting a steroid injection for lateral epicondylitis.  She would like the opportunity consult with orthopedics, I placed a referral for her today She will let me know if I can do a thing else to assist  This visit occurred during the SARS-CoV-2 public health emergency.  Safety protocols were in place, including screening questions prior to the visit, additional usage of staff PPE, and extensive cleaning of exam room while observing appropriate contact time as indicated for disinfecting solutions.     Signed Lamar Blinks, MD

## 2021-01-10 NOTE — Patient Instructions (Signed)
It was good to see you today- enjoy your trip to [facility]! Use the mobic daily as needed for pain and inflammation Flexeril can be used as needed for neck pain and spasm- this will make you drowsy, do not use when you need to drive  Take care and let me know if you need anything further You can call [facility] and make an appt to be seen for a possible elbow injection also if you like

## 2021-01-11 ENCOUNTER — Other Ambulatory Visit (HOSPITAL_COMMUNITY): Payer: Self-pay

## 2021-01-11 MED ORDER — PANTOPRAZOLE SODIUM 40 MG PO TBEC
40.0000 mg | DELAYED_RELEASE_TABLET | Freq: Every day | ORAL | 2 refills | Status: DC
  Filled 2021-01-11: qty 30, 30d supply, fill #0
  Filled 2021-09-26: qty 60, 60d supply, fill #0

## 2021-01-15 ENCOUNTER — Other Ambulatory Visit (HOSPITAL_COMMUNITY): Payer: Self-pay

## 2021-01-15 ENCOUNTER — Telehealth: Payer: Self-pay

## 2021-01-15 MED ORDER — SCOPOLAMINE 1 MG/3DAYS TD PT72
1.0000 | MEDICATED_PATCH | TRANSDERMAL | 1 refills | Status: DC
  Filled 2021-01-15: qty 4, 12d supply, fill #0

## 2021-01-15 NOTE — Telephone Encounter (Signed)
Pt called stating she is flying to [facility] later this week (would need to be called in by Wednesday) and would like PCP to call in some nausea patches to wear when flying.  Pharmacy is [facility].

## 2021-01-16 ENCOUNTER — Ambulatory Visit (INDEPENDENT_AMBULATORY_CARE_PROVIDER_SITE_OTHER): Payer: No Typology Code available for payment source | Admitting: Medical

## 2021-01-16 ENCOUNTER — Other Ambulatory Visit: Payer: Self-pay

## 2021-01-16 ENCOUNTER — Encounter: Payer: Self-pay | Admitting: Medical

## 2021-01-16 VITALS — BP 130/78 | HR 82 | Temp 98.5°F | Resp 18 | Ht 65.0 in | Wt 165.2 lb

## 2021-01-16 DIAGNOSIS — E039 Hypothyroidism, unspecified: Secondary | ICD-10-CM

## 2021-01-16 DIAGNOSIS — H6122 Impacted cerumen, left ear: Secondary | ICD-10-CM | POA: Diagnosis not present

## 2021-01-16 DIAGNOSIS — R42 Dizziness and giddiness: Secondary | ICD-10-CM

## 2021-01-16 LAB — COMPREHENSIVE METABOLIC PANEL
ALT: 18 U/L (ref 0–35)
AST: 19 U/L (ref 0–37)
Albumin: 4.5 g/dL (ref 3.5–5.2)
Alkaline Phosphatase: 68 U/L (ref 39–117)
BUN: 22 mg/dL (ref 6–23)
CO2: 27 mEq/L (ref 19–32)
Calcium: 9.3 mg/dL (ref 8.4–10.5)
Chloride: 106 mEq/L (ref 96–112)
Creatinine, Ser: 0.77 mg/dL (ref 0.40–1.20)
GFR: 83.99 mL/min (ref >60.00)
Glucose, Bld: 86 mg/dL (ref 70–99)
Potassium: 4.2 mEq/L (ref 3.5–5.1)
Sodium: 140 mEq/L (ref 135–145)
Total Bilirubin: 0.8 mg/dL (ref 0.2–1.2)
Total Protein: 7 g/dL (ref 6.0–8.3)

## 2021-01-16 LAB — CBC WITH DIFFERENTIAL/PLATELET
Basophils Absolute: 0 10*3/uL (ref 0.0–0.1)
Basophils Relative: 0.7 % (ref 0.0–3.0)
Eosinophils Absolute: 0.1 10*3/uL (ref 0.0–0.7)
Eosinophils Relative: 1.9 % (ref 0.0–5.0)
HCT: 41 % (ref 36.0–46.0)
Hemoglobin: 13.8 g/dL (ref 12.0–15.0)
Lymphocytes Relative: 30.2 % (ref 12.0–46.0)
Lymphs Abs: 1.5 10*3/uL (ref 0.7–4.0)
MCHC: 33.8 g/dL (ref 30.0–36.0)
MCV: 89.6 fl (ref 78.0–100.0)
Monocytes Absolute: 0.4 10*3/uL (ref 0.1–1.0)
Monocytes Relative: 8.9 % (ref 3.0–12.0)
Neutro Abs: 2.9 10*3/uL (ref 1.4–7.7)
Neutrophils Relative %: 58.3 % (ref 43.0–77.0)
Platelets: 248 10*3/uL (ref 150.0–400.0)
RBC: 4.57 Mil/uL (ref 3.87–5.11)
RDW: 13.4 % (ref 11.5–15.5)
WBC: 5 10*3/uL (ref 4.0–10.5)

## 2021-01-16 NOTE — Progress Notes (Signed)
Subjective:    Patient ID: Elizabeth Dennis, female    DOB: 1961-03-09, 60 y.o.   MRN: [id number]  HPI  Pt in for some recent dizziness. Pt states yesterday when laying down and turning her head had dizziness. Pt took dramamine yesterday and felt improved within 2 hours. Pt feels better now. Pt states 2 years ago she did have vertigo/motion sickness. No ha, no blurred vision, no gross motor or sensory function deficits.  Dr. Larry Sierras sent in scopolamine patch yesterday. For upcoming trip to [place].  Pt has hx of low thyroid. She is on thyroid med. Pt had been off her synthroid for 2 weeks and recently restarted med. Last tsh was 5.30.    Pt has not had thyroid level rechecked since February.     Review of Systems  Constitutional:  Negative for chills, fatigue and fever.  HENT:  Negative for congestion, dental problem, ear pain, hearing loss, postnasal drip and rhinorrhea.   Respiratory:  Negative for cough, chest tightness and wheezing.   Cardiovascular:  Negative for chest pain and palpitations.  Gastrointestinal:  Negative for abdominal pain.  Genitourinary:  Negative for dysuria and frequency.  Musculoskeletal:  Negative for back pain, joint swelling and myalgias.  Skin:  Negative for rash.  Neurological:  Positive for dizziness. Negative for tremors.       See hpi. But minimal on exam.   Hematological:  Negative for adenopathy.  Psychiatric/Behavioral:  Negative for behavioral problems and confusion. The patient is not nervous/anxious.      Past Medical History:  Diagnosis Date   Acquired hypothyroidism 09/13/2020   Allergy      Social History   Socioeconomic History   Marital status: Single    Spouse name: Not on file   Number of children: Not on file   Years of education: Not on file   Highest education level: Not on file  Occupational History   Occupation: accounts  Tobacco Use   Smoking status: Never   Smokeless tobacco: Never  Substance and Sexual Activity    Alcohol use: No    Alcohol/week: 0.0 standard drinks   Drug use: No   Sexual activity: Not on file  Other Topics Concern   Not on file  Social History Narrative   Not on file   Social Determinants of Health   Financial Resource Strain: Not on file  Food Insecurity: Not on file  Transportation Needs: Not on file  Physical Activity: Not on file  Stress: Not on file  Social Connections: Not on file  Intimate Partner Violence: Not on file    Past Surgical History:  Procedure Laterality Date   CESAREAN SECTION      Family History  Problem Relation Age of Onset   Breast cancer Mother    Cervical cancer Mother    COPD Mother    Hypertension Mother    COPD Father     Allergies  Allergen Reactions   Penicillins Hives    Childhood - ? Hives    Current Outpatient Medications on File Prior to Visit  Medication Sig Dispense Refill   buPROPion (WELLBUTRIN SR) 150 MG 12 hr tablet Take 1 tablet (150 mg total) by mouth 2 (two) times daily. 60 tablet 6   cyclobenzaprine (FLEXERIL) 10 MG tablet Take 1 tablet (10 mg total) by mouth 2 (two) times daily as needed for muscle spasms. 30 tablet 0   levothyroxine (SYNTHROID) 50 MCG tablet Take 1 tablet (50 mcg total) by mouth  daily. 30 tablet 3   levothyroxine (SYNTHROID) 50 MCG tablet Take 1 tablet (50 mcg total) by mouth daily. 30 tablet 3   meloxicam (MOBIC) 7.5 MG tablet Take 1 tablet (7.5 mg total) by mouth daily. **Use as needed 30 tablet 0   pantoprazole (PROTONIX) 40 MG tablet Take 1 tablet (40 mg total) by mouth daily. Office visit for further refills 30 tablet 2   phentermine 15 MG capsule TAKE 1 CAPSULE BY MOUTH EVERY MORNING 30 capsule 1   scopolamine (TRANSDERM-SCOP, 1.5 MG,) 1 MG/3DAYS Place 1 patch (1.5 mg total) onto the skin every 3 (three) days. Use as needed for travel. Apply 4 hours before effect desired 4 patch 1   No current facility-administered medications on file prior to visit.    BP (!) 134/99   Pulse 82    Temp 98.5 F (36.9 C)   Resp 18   Ht 5\' 5"  (1.651 m)   Wt 165 lb 3.2 oz (74.9 kg)   SpO2 99%   BMI 27.49 kg/m       Objective:   Physical Exam  General Mental Status- Alert. General Appearance- Not in acute distress.   Skin General: Color- Normal Color. Moisture- Normal Moisture.  Neck Carotid Arteries- Normal color. Moisture- Normal Moisture. No carotid bruits. No JVD.  Chest and Lung Exam Auscultation: Breath Sounds:-Normal.  Cardiovascular Auscultation:Rythm- Regular. Murmurs & Other Heart Sounds:Auscultation of the heart reveals- No Murmurs.  Abdomen Inspection:-Inspeection Normal. Palpation/Percussion:Note:No mass. Palpation and Percussion of the abdomen reveal- Non Tender, Non Distended + BS, no rebound or guarding.  Heent- no sinus pressure.  Neurologic Cranial Nerve exam:- CN III-XII intact(No nystagmus), symmetric smile. Drift Test:- No drift. Romberg Exam:- Negative.  Heal to Toe Gait exam:-Normal. Finger to Nose:- Normal/Intact Strength:- 5/5 equal and symmetric strength both upper and lower extremities.  On lying supine some slight vertigo/dizziness. when turns head to left and rt. Worse on left side.      Assessment & Plan:   Recent mild vertigo type symptoms yesterday and minimal residual vertigo/dizziness present today.  Normal neurologic exam with only mild faint dizziness when lying supine and turning head to the left and right.  No gross motor or sensory function deficits.  Would recommend getting CBC and metabolic panel today.  Would recommend starting scopolamine patch today in light of upcoming trip to [place].  If you have recurrent vertigo type symptoms on turning her head while lying supine then would try Epley maneuvers.  When you get back from [facility] if you do have persisting intermittent vertigo could refer you to physical therapy.  If you have any dizziness with any gross motor or sensory function deficits then recommend be seen in the  emergency department for stat imaging.  This is not indicated presently.  Recommend checking your thyroid studies in about 6 to 8 weeks.  If having recurrent dizziness would recommend checking BP and pulse at that time as well.  Follow-up in 10 to 14 days or as needed.  Mackie Pai, PA-C

## 2021-01-16 NOTE — Patient Instructions (Signed)
Recent mild vertigo type symptoms yesterday and minimal residual vertigo/dizziness present today.  Normal neurologic exam with only mild faint dizziness when lying supine and turning head to the left and right.  No gross motor or sensory function deficits.  Would recommend getting CBC and metabolic panel today.  Would recommend starting scopolamine patch today in light of upcoming trip to [place].  If you have recurrent vertigo type symptoms on turning her head while lying supine then would try Epley maneuvers.  When you get back from [facility] if you do have persisting intermittent vertigo could refer you to physical therapy.  If you have any dizziness with any gross motor or sensory function deficits then recommend be seen in the emergency department for stat imaging.  This is not indicated presently.  Recommend checking your thyroid studies in about 6 to 8 weeks.  If having recurrent dizziness would recommend checking BP and pulse at that time as well.  Follow-up in 10 to 14 days or as needed.

## 2021-01-31 ENCOUNTER — Telehealth (INDEPENDENT_AMBULATORY_CARE_PROVIDER_SITE_OTHER): Payer: No Typology Code available for payment source | Admitting: Family

## 2021-01-31 ENCOUNTER — Other Ambulatory Visit: Payer: Self-pay

## 2021-01-31 VITALS — BP 134/92 | HR 87 | Temp 98.2°F

## 2021-01-31 DIAGNOSIS — J019 Acute sinusitis, unspecified: Secondary | ICD-10-CM | POA: Diagnosis not present

## 2021-01-31 MED ORDER — DOXYCYCLINE HYCLATE 100 MG PO TABS: 100.0000 mg | ORAL_TABLET | Freq: Two times a day (BID) | ORAL | 0 refills | Status: DC

## 2021-01-31 MED ORDER — ONDANSETRON 4 MG PO TBDP: 4.0000 mg | ORAL_TABLET | Freq: Three times a day (TID) | ORAL | 0 refills | Status: DC | PRN

## 2021-01-31 NOTE — Progress Notes (Signed)
MyChart Video Visit    Virtual Visit via Video Note   This visit type was conducted due to national recommendations for restrictions regarding the COVID-19 Pandemic (e.g. social distancing) in an effort to limit this patient's exposure and mitigate transmission in our community. This patient is at least at moderate risk for complications without adequate follow up. This format is felt to be most appropriate for this patient at this time. Physical exam was limited by quality of the video and audio technology used for the visit. CMA was able to get the patient set up on a video visit.  Patient location: Home. Patient and provider in visit Provider location: Office  I discussed the limitations of evaluation and management by telemedicine and the availability of in person appointments. The patient expressed understanding and agreed to proceed.  Visit Date: 01/31/2021  Today's healthcare provider: Nance Pear, NP     Subjective:    Patient ID: Elizabeth Dennis, female    DOB: 1960-11-20, 61 y.o.   MRN: [id number]  Chief Complaint  Patient presents with   Cough    Complains of persistent cough   Nasal Congestion    Complains of nasal and chest congestion and drainage   Headache    Complains of headache and sinus pressure     HPI  Patient is a 59 yr old female who presents today with c/o sinus congestin. She recently travelled to [facility] (returned home on 6/21).  Symptoms began on 01/25/21. Had runny nose. Took a covid test (negative).  Then developed + cough,  and now her ears "stopped up." She denies associated fever, chills.  She notes some nausea from coughing/congestion. She is using zyrtec-D and flonase and tried benadryl x 1.  No significant improvement. Reports + sinus tenderness to palpation of forehead and near the sides of her nose on her cheeks.    Past Medical History:  Diagnosis Date   Acquired hypothyroidism 09/13/2020   Allergy     Past Surgical  History:  Procedure Laterality Date   CESAREAN SECTION      Family History  Problem Relation Age of Onset   Breast cancer Mother    Cervical cancer Mother    COPD Mother    Hypertension Mother    COPD Father     Social History   Socioeconomic History   Marital status: Single    Spouse name: Not on file   Number of children: Not on file   Years of education: Not on file   Highest education level: Not on file  Occupational History   Occupation: accounts  Tobacco Use   Smoking status: Never   Smokeless tobacco: Never  Substance and Sexual Activity   Alcohol use: No    Alcohol/week: 0.0 standard drinks   Drug use: No   Sexual activity: Not on file  Other Topics Concern   Not on file  Social History Narrative   Not on file   Social Determinants of Health   Financial Resource Strain: Not on file  Food Insecurity: Not on file  Transportation Needs: Not on file  Physical Activity: Not on file  Stress: Not on file  Social Connections: Not on file  Intimate Partner Violence: Not on file    Outpatient Medications Prior to Visit  Medication Sig Dispense Refill   buPROPion (WELLBUTRIN SR) 150 MG 12 hr tablet Take 1 tablet (150 mg total) by mouth 2 (two) times daily. 60 tablet 6   cyclobenzaprine (  FLEXERIL) 10 MG tablet Take 1 tablet (10 mg total) by mouth 2 (two) times daily as needed for muscle spasms. 30 tablet 0   levothyroxine (SYNTHROID) 50 MCG tablet Take 1 tablet (50 mcg total) by mouth daily. 30 tablet 3   levothyroxine (SYNTHROID) 50 MCG tablet Take 1 tablet (50 mcg total) by mouth daily. 30 tablet 3   meloxicam (MOBIC) 7.5 MG tablet Take 1 tablet (7.5 mg total) by mouth daily. **Use as needed 30 tablet 0   pantoprazole (PROTONIX) 40 MG tablet Take 1 tablet (40 mg total) by mouth daily. Office visit for further refills 30 tablet 2   scopolamine (TRANSDERM-SCOP, 1.5 MG,) 1 MG/3DAYS Place 1 patch (1.5 mg total) onto the skin every 3 (three) days. Use as needed for  travel. Apply 4 hours before effect desired 4 patch 1   phentermine 15 MG capsule TAKE 1 CAPSULE BY MOUTH EVERY MORNING 30 capsule 1   No facility-administered medications prior to visit.    Allergies  Allergen Reactions   Penicillins Hives    Childhood - ? Hives    ROS    See HPI Objective:    Physical Exam  BP (!) 134/92 (BP Location: Left Arm, Patient Position: Sitting)   Pulse 87   Temp 98.2 F (36.8 C)  Wt Readings from Last 3 Encounters:  01/16/21 165 lb 3.2 oz (74.9 kg)  01/10/21 164 lb (74.4 kg)  11/13/20 169 lb (76.7 kg)    Gen: Awake, alert, no acute distress ENT: + maxillary/frontal sinus tenderness to palpation bilaterally Resp: Breathing is even and non-labored Psych: calm/pleasant demeanor Neuro: Alert and Oriented x 3, + facial symmetry, speech is clear.     Assessment & Plan:   Problem List Items Addressed This Visit       Unprioritized   Acute sinusitis - Primary    New. Will rx with doxycycline 100mg  bid x 10 days.  Recommended nasal saline rinses, tylenol prn pain, delsym OTC PRN cough,zofran 4mg  prn nausea. Pt is advised to call if symptoms worsen or if symptoms do not improve.        Relevant Medications   doxycycline (VIBRA-TABS) 100 MG tablet    I am having Elizabeth Dennis start on ondansetron. I am also having her maintain her buPROPion, levothyroxine, phentermine, levothyroxine, pantoprazole, meloxicam, cyclobenzaprine, scopolamine, and doxycycline.  Meds ordered this encounter  Medications   doxycycline (VIBRA-TABS) 100 MG tablet    Sig: Take 1 tablet (100 mg total) by mouth 2 (two) times daily.    Dispense:  20 tablet    Refill:  0    Order Specific Question:   Supervising Provider    Answer:   Penni Homans A [[id number]]   ondansetron (ZOFRAN ODT) 4 MG disintegrating tablet    Sig: Take 1 tablet (4 mg total) by mouth every 8 (eight) hours as needed for nausea or vomiting.    Dispense:  20 tablet    Refill:  0    Order Specific  Question:   Supervising Provider    Answer:   Penni Homans A [[id number]]    I discussed the assessment and treatment plan with the patient. The patient was provided an opportunity to ask questions and all were answered. The patient agreed with the plan and demonstrated an understanding of the instructions.   The patient was advised to call back or seek an in-person evaluation if the symptoms worsen or if the condition fails to improve as anticipated.   Lavon Horn  Rowe Pavy, NP [employer] [phone] (phone) [phone] (fax)  [place]

## 2021-01-31 NOTE — Assessment & Plan Note (Signed)
New. Will rx with doxycycline 100mg  bid x 10 days.  Recommended nasal saline rinses, tylenol prn pain, delsym OTC PRN cough,zofran 4mg  prn nausea. Pt is advised to call if symptoms worsen or if symptoms do not improve.

## 2021-02-20 ENCOUNTER — Encounter: Payer: Self-pay | Admitting: Family

## 2021-02-20 ENCOUNTER — Ambulatory Visit (INDEPENDENT_AMBULATORY_CARE_PROVIDER_SITE_OTHER): Payer: No Typology Code available for payment source | Admitting: Family

## 2021-02-20 ENCOUNTER — Other Ambulatory Visit: Payer: Self-pay

## 2021-02-20 ENCOUNTER — Other Ambulatory Visit (HOSPITAL_BASED_OUTPATIENT_CLINIC_OR_DEPARTMENT_OTHER): Payer: Self-pay

## 2021-02-20 VITALS — BP 100/70 | HR 85 | Temp 97.6°F | Resp 97 | Ht 65.0 in | Wt 165.4 lb

## 2021-02-20 DIAGNOSIS — J019 Acute sinusitis, unspecified: Secondary | ICD-10-CM | POA: Diagnosis not present

## 2021-02-20 DIAGNOSIS — J209 Acute bronchitis, unspecified: Secondary | ICD-10-CM

## 2021-02-20 MED ORDER — AZITHROMYCIN 250 MG PO TABS
ORAL_TABLET | ORAL | 0 refills | Status: DC
  Filled 2021-02-20: qty 6, 5d supply, fill #0

## 2021-02-20 MED ORDER — HYDROCODONE BIT-HOMATROP MBR 5-1.5 MG/5ML PO SOLN
5.0000 mL | Freq: Three times a day (TID) | ORAL | 0 refills | Status: DC | PRN
  Filled 2021-02-20: qty 120, 8d supply, fill #0

## 2021-02-20 MED ORDER — PREDNISONE 20 MG PO TABS
40.0000 mg | ORAL_TABLET | Freq: Every day | ORAL | 0 refills | Status: DC
  Filled 2021-02-20: qty 10, 5d supply, fill #0

## 2021-02-20 NOTE — Progress Notes (Signed)
Elizabeth Dennis is a 60 y.o. female with the following history as recorded in EpicCare:  Patient Active Problem List   Diagnosis Date Noted   Acute sinusitis 01/31/2021   Cellulitis of right breast 11/13/2020   Acquired hypothyroidism 09/13/2020   Right leg paresthesias 04/03/2017    Current Outpatient Medications  Medication Sig Dispense Refill   azithromycin (ZITHROMAX) 250 MG tablet Take 2 tablets by mouth on day 1, then take 1 tablet daily until finished. 6 tablet 0   buPROPion (WELLBUTRIN SR) 150 MG 12 hr tablet Take 1 tablet (150 mg total) by mouth 2 (two) times daily. 60 tablet 6   cyclobenzaprine (FLEXERIL) 10 MG tablet Take 1 tablet (10 mg total) by mouth 2 (two) times daily as needed for muscle spasms. 30 tablet 0   HYDROcodone bit-homatropine (HYCODAN) 5-1.5 MG/5ML syrup Take 5 mLs by mouth every 8 (eight) hours as needed for cough. 120 mL 0   levothyroxine (SYNTHROID) 50 MCG tablet Take 1 tablet (50 mcg total) by mouth daily. 30 tablet 3   pantoprazole (PROTONIX) 40 MG tablet Take 1 tablet (40 mg total) by mouth daily. Office visit for further refills 30 tablet 2   predniSONE (DELTASONE) 20 MG tablet Take 2 tablets (40 mg total) by mouth daily with breakfast. 10 tablet 0   scopolamine (TRANSDERM-SCOP, 1.5 MG,) 1 MG/3DAYS Place 1 patch (1.5 mg total) onto the skin every 3 (three) days. Use as needed for travel. Apply 4 hours before effect desired 4 patch 1   meloxicam (MOBIC) 7.5 MG tablet Take 1 tablet (7.5 mg total) by mouth daily. **Use as needed (Patient not taking: Reported on 02/20/2021) 30 tablet 0   ondansetron (ZOFRAN ODT) 4 MG disintegrating tablet Take 1 tablet (4 mg total) by mouth every 8 (eight) hours as needed for nausea or vomiting. (Patient not taking: Reported on 02/20/2021) 20 tablet 0   phentermine 15 MG capsule TAKE 1 CAPSULE BY MOUTH EVERY MORNING 30 capsule 1   No current facility-administered medications for this visit.    Allergies: Penicillins  Past  Medical History:  Diagnosis Date   Acquired hypothyroidism 09/13/2020   Allergy     Past Surgical History:  Procedure Laterality Date   CESAREAN SECTION      Family History  Problem Relation Age of Onset   Breast cancer Mother    Cervical cancer Mother    COPD Mother    Hypertension Mother    COPD Father     Social History   Tobacco Use   Smoking status: Never   Smokeless tobacco: Never  Substance Use Topics   Alcohol use: No    Alcohol/week: 0.0 standard drinks    Subjective:  Patient went to [facility] approximately 1 month ago; notes that has been sick ever since- very dry/ exposed to increased cigarette smoke; Was treated for sinus infection with 10 day course of Doxycycline- notes she just hasn't felt good since getting home;    Objective:  Vitals:   02/20/21 1055  BP: 100/70  Pulse: 85  Resp: (!) 97  Temp: 97.6 F (36.4 C)  TempSrc: Oral  Weight: 165 lb 6.4 oz (75 kg)  Height: 5\' 5"  (1.651 m)    General: Well developed, well nourished, in no acute distress  Skin : Warm and dry.  Head: Normocephalic and atraumatic  Eyes: Sclera and conjunctiva clear; pupils round and reactive to light; extraocular movements intact  Ears: External normal; canals clear; tympanic membranes normal  Oropharynx:  Pink, supple. No suspicious lesions  Neck: Supple without thyromegaly, adenopathy  Lungs: Respirations unlabored; clear to auscultation bilaterally without wheeze, rales, rhonchi  CVS exam: normal rate and regular rhythm.  Abdomen: Soft; nontender; nondistended; normoactive bowel sounds; no masses or hepatosplenomegaly  Musculoskeletal: No deformities; no active joint inflammation  Extremities: No edema, cyanosis, clubbing  Vessels: Symmetric bilaterally  Neurologic: Alert and oriented; speech intact; face symmetrical; moves all extremities well; CNII-XII intact without focal deficit   Assessment:  1. Acute sinusitis, recurrence not specified, unspecified location   2.  Acute bronchitis, unspecified organism     Plan:   Rx for Z-pak, Prednisone and Hycodan; increase fluids, rest and follow up worse, no better.  This visit occurred during the SARS-CoV-2 public health emergency.  Safety protocols were in place, including screening questions prior to the visit, additional usage of staff PPE, and extensive cleaning of exam room while observing appropriate contact time as indicated for disinfecting solutions.    No follow-ups on file.  No orders of the defined types were placed in this encounter.   Requested Prescriptions   Signed Prescriptions Disp Refills   azithromycin (ZITHROMAX) 250 MG tablet 6 tablet 0    Sig: Take 2 tablets by mouth on day 1, then take 1 tablet daily until finished.   predniSONE (DELTASONE) 20 MG tablet 10 tablet 0    Sig: Take 2 tablets (40 mg total) by mouth daily with breakfast.   HYDROcodone bit-homatropine (HYCODAN) 5-1.5 MG/5ML syrup 120 mL 0    Sig: Take 5 mLs by mouth every 8 (eight) hours as needed for cough.

## 2021-02-22 ENCOUNTER — Ambulatory Visit: Payer: No Typology Code available for payment source | Admitting: Family

## 2021-02-23 ENCOUNTER — Other Ambulatory Visit (HOSPITAL_COMMUNITY): Payer: Self-pay

## 2021-03-05 ENCOUNTER — Ambulatory Visit (INDEPENDENT_AMBULATORY_CARE_PROVIDER_SITE_OTHER): Payer: No Typology Code available for payment source | Admitting: Family Medicine

## 2021-03-05 ENCOUNTER — Other Ambulatory Visit: Payer: Self-pay

## 2021-03-05 ENCOUNTER — Encounter: Payer: Self-pay | Admitting: Family Medicine

## 2021-03-05 VITALS — BP 118/74 | HR 103 | Temp 98.1°F | Resp 16 | Ht 65.0 in | Wt 163.0 lb

## 2021-03-05 DIAGNOSIS — E039 Hypothyroidism, unspecified: Secondary | ICD-10-CM

## 2021-03-05 DIAGNOSIS — N63 Unspecified lump in unspecified breast: Secondary | ICD-10-CM

## 2021-03-05 MED ORDER — DOXYCYCLINE HYCLATE 100 MG PO CAPS: 100.0000 mg | ORAL_CAPSULE | Freq: Two times a day (BID) | ORAL | 0 refills | Status: DC

## 2021-03-05 NOTE — Progress Notes (Addendum)
[facility] [place], [place], [place] [address] [phone] [phone]  Date:  03/05/2021   Name:  Elizabeth Dennis   DOB:  08-28-1960   MRN:  [id number]  PCP:  Darreld Mclean, MD    Chief Complaint: Breast Abscess (Right breast, taken doxy in June, improved but came back)   History of Present Illness:  Elizabeth Dennis is a 60 y.o. very pleasant female patient who presents with the following:  Patient is seen today with concern of an "abscess" in her right breast Last visit with myself was in June  She did have cellulitis of the right breast back in April and was treated with doxycycline She had a mammogram at that time which was normal  As of 4 days ago she noted a tender bump at the lateal aspect of the right nipple -this is a new finding She feels that the area is perhaps a bit red, this made her think the cellulitis might have returned  Otherwise she is feeling well, no flulike symptoms  We will follow-up on her thyroid today, levothyroxine  dose change made earlier this year Lab Results  Component Value Date   TSH 5.30 (H) 09/13/2020     Patient Active Problem List   Diagnosis Date Noted   Acute sinusitis 01/31/2021   Cellulitis of right breast 11/13/2020   Acquired hypothyroidism 09/13/2020   Right leg paresthesias 04/03/2017    Past Medical History:  Diagnosis Date   Acquired hypothyroidism 09/13/2020   Allergy     Past Surgical History:  Procedure Laterality Date   CESAREAN SECTION      Social History   Tobacco Use   Smoking status: Never   Smokeless tobacco: Never  Substance Use Topics   Alcohol use: No    Alcohol/week: 0.0 standard drinks   Drug use: No    Family History  Problem Relation Age of Onset   Breast cancer Mother    Cervical cancer Mother    COPD Mother    Hypertension Mother    COPD Father     Allergies  Allergen Reactions   Penicillins Hives    Childhood - ?  Hives    Medication list has been reviewed and updated.  Current Outpatient Medications on File Prior to Visit  Medication Sig Dispense Refill   buPROPion (WELLBUTRIN SR) 150 MG 12 hr tablet Take 1 tablet (150 mg total) by mouth 2 (two) times daily. (Patient not taking: Reported on 03/05/2021) 60 tablet 6   levothyroxine (SYNTHROID) 50 MCG tablet Take 1 tablet (50 mcg total) by mouth daily. 30 tablet 3   pantoprazole (PROTONIX) 40 MG tablet Take 1 tablet (40 mg total) by mouth daily. Office visit for further refills 30 tablet 2   No current facility-administered medications on file prior to visit.    Review of Systems:  As per HPI- otherwise negative.   Physical Examination: Vitals:   03/05/21 1515  BP: 118/74  Pulse: (!) 103  Resp: 16  Temp: 98.1 F (36.7 C)  SpO2: 99%   Vitals:   03/05/21 1515  Weight: 163 lb (73.9 kg)  Height: '5\' 5"'$  (1.651 m)   Body mass index is 27.12 kg/m. Ideal Body Weight: Weight in (lb) to have BMI = 25: 149.9  GEN: no acute distress.  Overweight, looks well  HEENT: Atraumatic, Normocephalic.  Ears and Nose: No external deformity. CV: RRR, No M/G/R. No JVD. No  thrill. No extra heart sounds. PULM: CTA B, no wheezes, crackles, rhonchi. No retractions. No resp. distress. No accessory muscle use. EXTR: No c/c/e PSYCH: Normally interactive. Conversant.  Right breast:  There is an approx one cm diameter tender mass at 9:00 under the lateral border of the right nipple.  It is mobile, likely a cyst.  There is perhaps some minimal redness overlying this area but I do not see any true evidence of cellulitis.  There is no heat   Assessment and Plan: Breast mass - Plan: doxycycline (VIBRAMYCIN) 100 MG capsule, US BREAST LTD UNI RIGHT INC AXILLA, CANCELED: US BREAST COMPLETE UNI RIGHT INC AXILLA  Hypothyroidism, unspecified type - Plan: TSH Patient here today with concern of breast change on the right side.  On exam I believe this is a cyst or other  mass, not cellulitis.  I have ordered a breast ultrasound and scheduled this for the first available at the breast center.  I did give her a prescription for doxycycline in case redness or tenderness seems to get worse.  She will let me know in this case  Also recheck TSH today This visit occurred during the SARS-CoV-2 public health emergency.  Safety protocols were in place, including screening questions prior to the visit, additional usage of staff PPE, and extensive cleaning of exam room while observing appropriate contact time as indicated for disinfecting solutions.   Signed Lamar Blinks, MD  Received her TSH 8/2- normal Message to pt  Results for orders placed or performed in visit on 03/05/21  TSH  Result Value Ref Range   TSH 3.77 0.35 - 5.50 uIU/mL

## 2021-03-05 NOTE — Patient Instructions (Signed)
Good to see you again today- I will be in touch with your thyroid level level asap We will get an ultrasound of the right breast- I think you most likely have a cyst. If you like you can take the doxycycline antibiotic- certainly start taking this if you develop more redness, tenderness or heat.  However otherwise I don't see any definite sign of infection right now so ok to hold off on the antibiotic

## 2021-03-06 ENCOUNTER — Encounter: Payer: Self-pay | Admitting: Family Medicine

## 2021-03-06 ENCOUNTER — Other Ambulatory Visit: Payer: Self-pay | Admitting: Family Medicine

## 2021-03-06 DIAGNOSIS — N63 Unspecified lump in unspecified breast: Secondary | ICD-10-CM

## 2021-03-06 LAB — TSH: TSH: 3.77 u[IU]/mL (ref 0.35–5.50)

## 2021-03-20 ENCOUNTER — Other Ambulatory Visit: Payer: No Typology Code available for payment source

## 2021-04-18 ENCOUNTER — Other Ambulatory Visit (HOSPITAL_BASED_OUTPATIENT_CLINIC_OR_DEPARTMENT_OTHER): Payer: Self-pay

## 2021-04-20 ENCOUNTER — Other Ambulatory Visit (HOSPITAL_BASED_OUTPATIENT_CLINIC_OR_DEPARTMENT_OTHER): Payer: Self-pay

## 2021-05-10 IMAGING — MG DIGITAL SCREENING BILAT W/ TOMO W/ CAD
6 of 10 series · 6 of 30 positions shown · non-contrast
Comparison: Previous exam(s).

CLINICAL DATA: Screening.

EXAM:
DIGITAL SCREENING BILATERAL MAMMOGRAM WITH TOMO AND CAD

[R CC synth-2D]
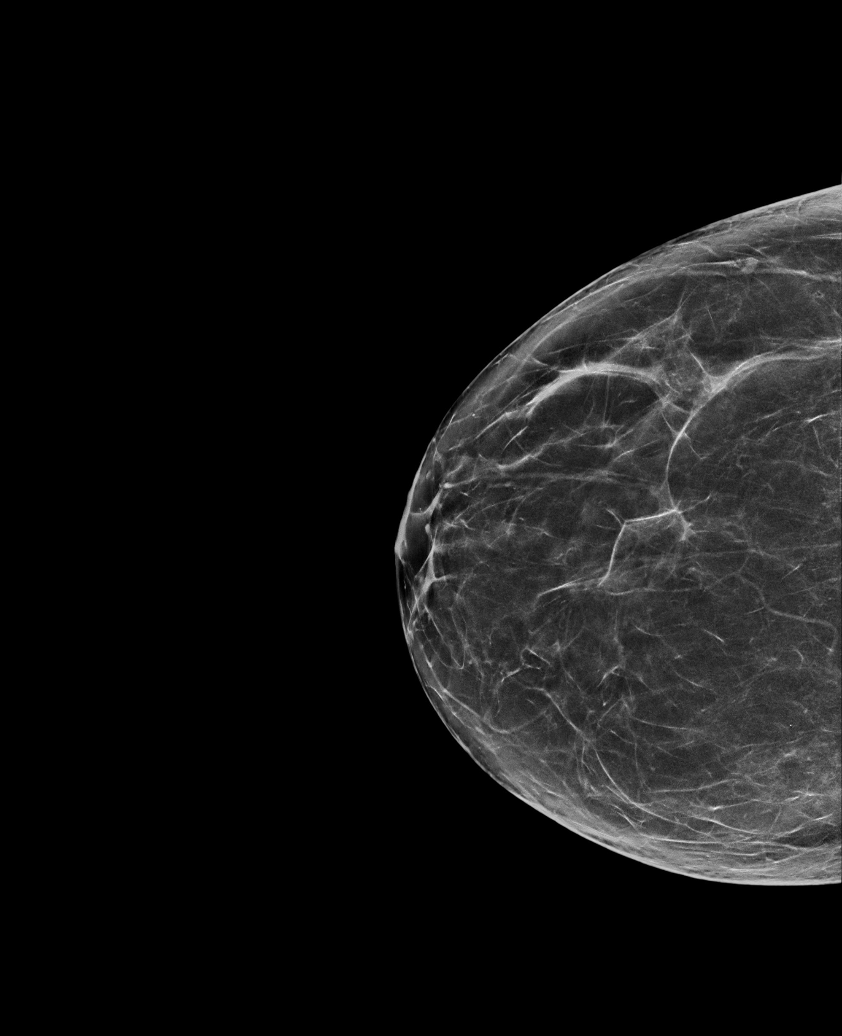

[R MLO synth-2D]
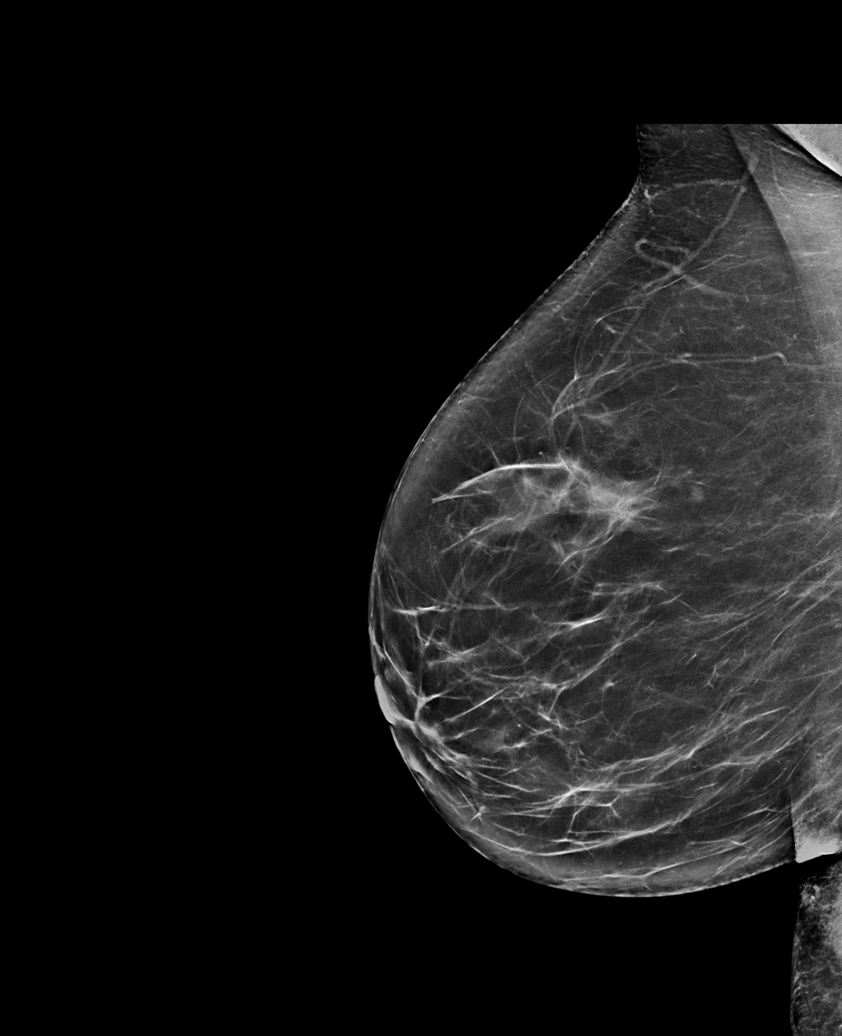

[L CC synth-2D]
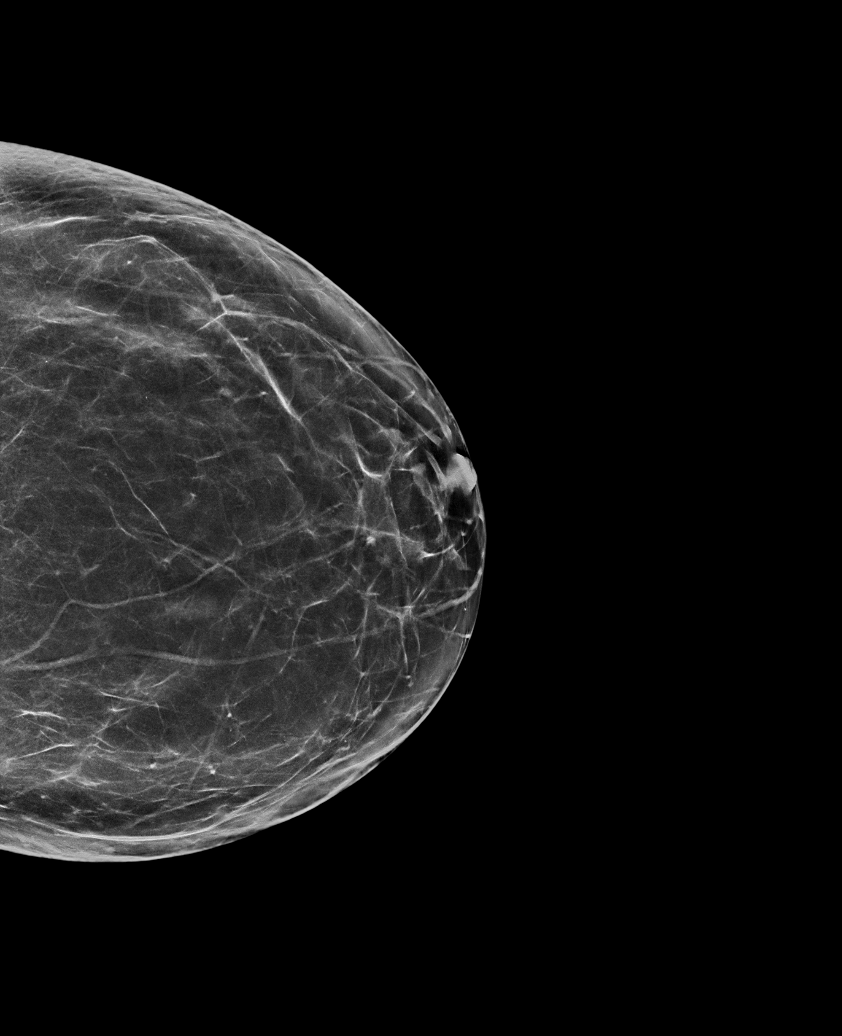

[R XCCL synth-2D]
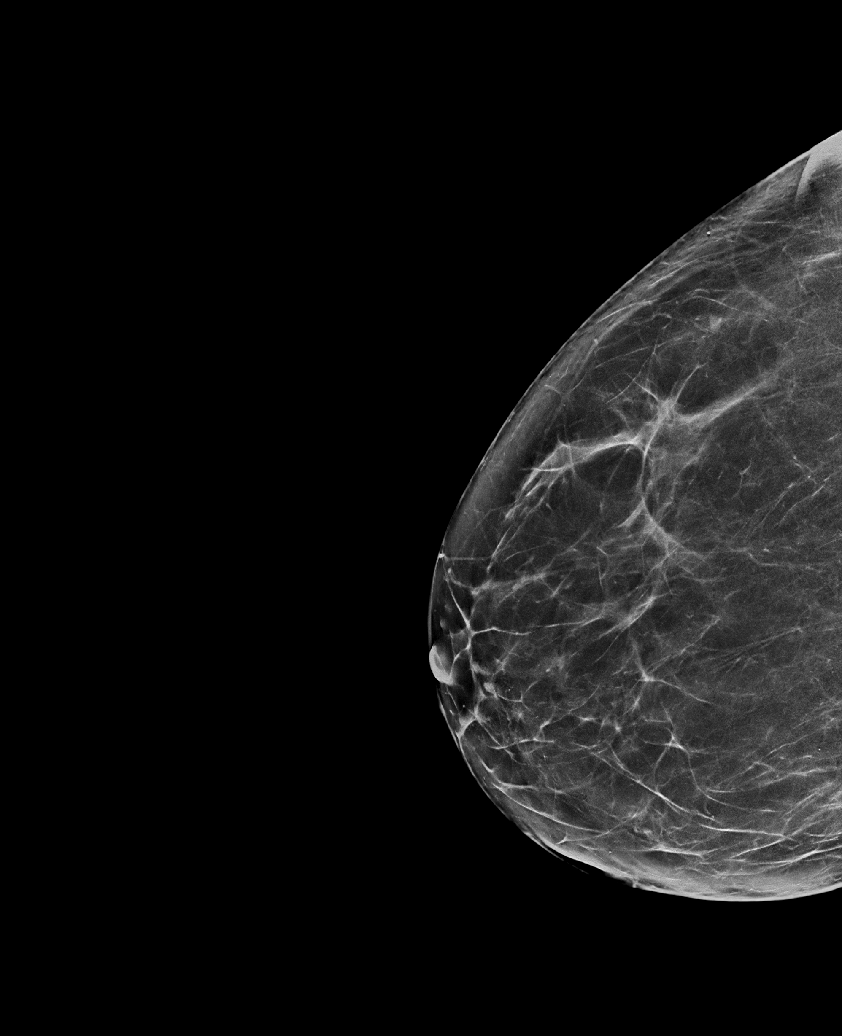

[L MLO synth-2D]
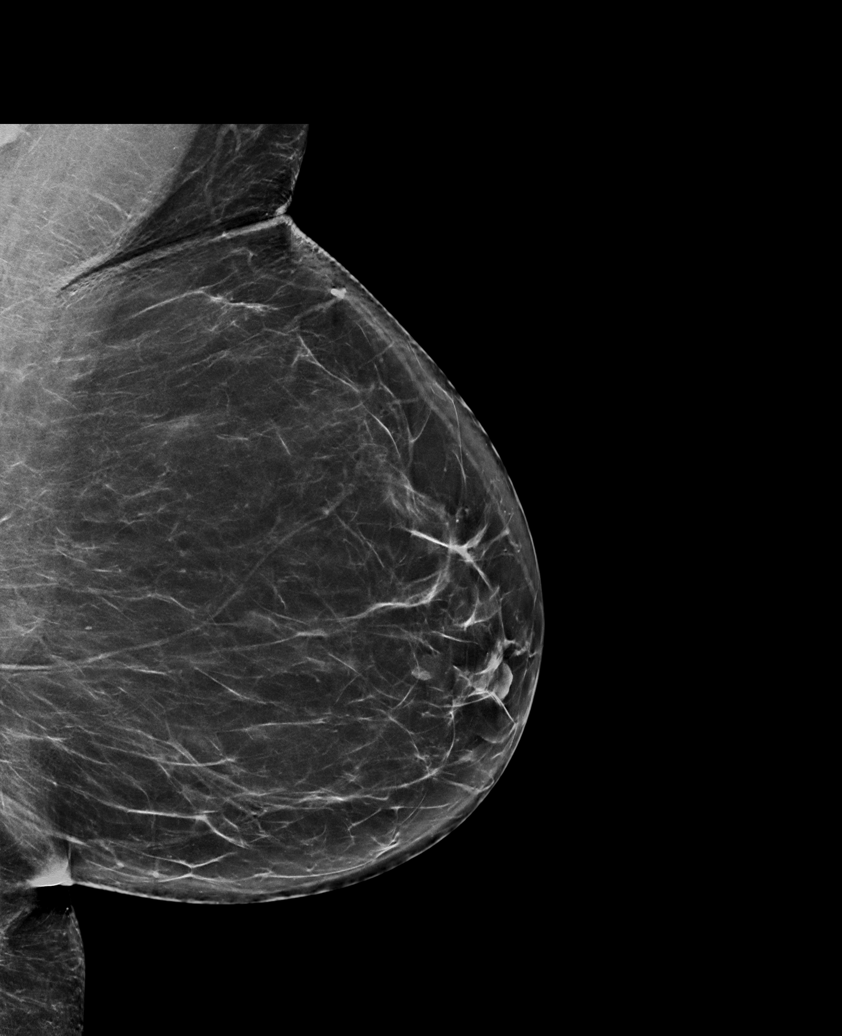

[L CC tomo · tomo slice 37/72.0]
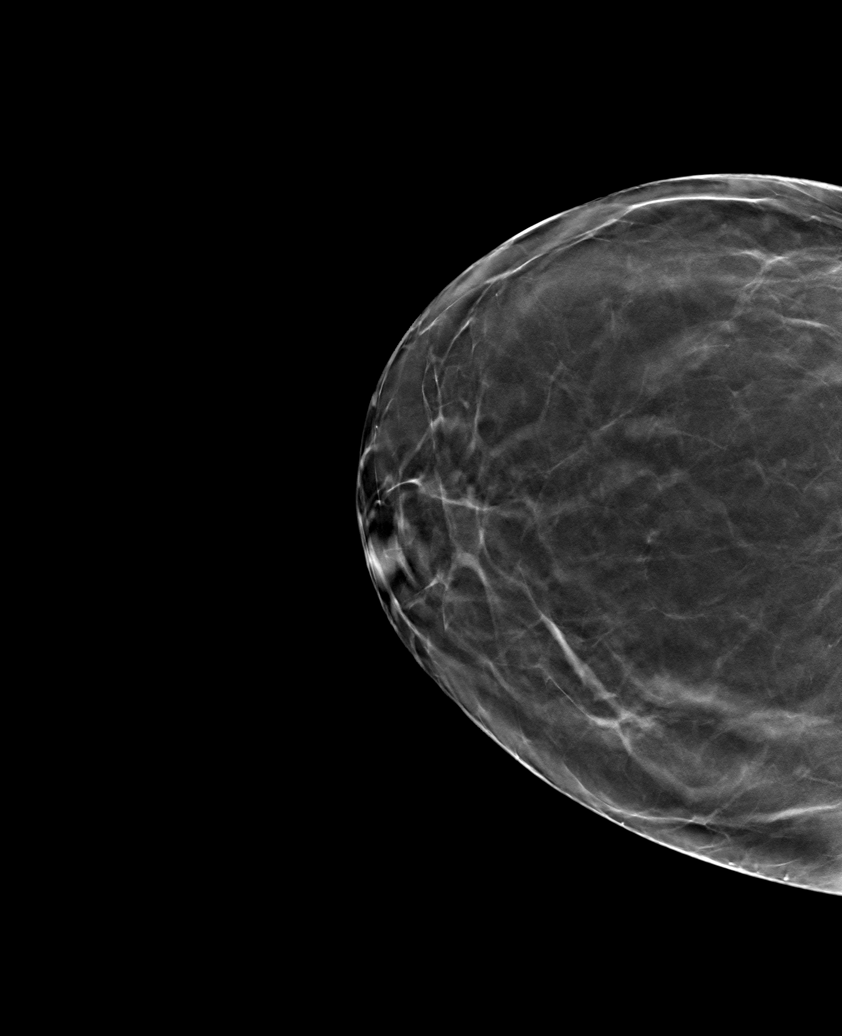

[6 of 30 positions shown; findings below may reference images not displayed]

ACR Breast Density Category b: There are scattered areas of
fibroglandular density.
FINDINGS: There are no findings suspicious for malignancy. Images were
processed with CAD.
IMPRESSION: No mammographic evidence of malignancy. A result letter of this
screening mammogram will be mailed directly to the patient.

RECOMMENDATION:
Screening mammogram in one year. (Code:[id number])

BI-RADS CATEGORY  1: Negative.

## 2021-05-28 ENCOUNTER — Other Ambulatory Visit (HOSPITAL_BASED_OUTPATIENT_CLINIC_OR_DEPARTMENT_OTHER): Payer: Self-pay

## 2021-05-28 ENCOUNTER — Other Ambulatory Visit: Payer: Self-pay | Admitting: Family Medicine

## 2021-05-28 MED ORDER — LEVOTHYROXINE SODIUM 50 MCG PO TABS
50.0000 ug | ORAL_TABLET | Freq: Every day | ORAL | 3 refills | Status: DC
  Filled 2021-05-28 – 2021-09-26 (×2): qty 30, 30d supply, fill #0
  Filled 2021-12-20: qty 30, 30d supply, fill #1

## 2021-06-05 ENCOUNTER — Other Ambulatory Visit (HOSPITAL_BASED_OUTPATIENT_CLINIC_OR_DEPARTMENT_OTHER): Payer: Self-pay

## 2021-09-26 ENCOUNTER — Other Ambulatory Visit (HOSPITAL_BASED_OUTPATIENT_CLINIC_OR_DEPARTMENT_OTHER): Payer: Self-pay

## 2021-10-10 ENCOUNTER — Other Ambulatory Visit (HOSPITAL_BASED_OUTPATIENT_CLINIC_OR_DEPARTMENT_OTHER): Payer: Self-pay

## 2021-10-10 ENCOUNTER — Other Ambulatory Visit: Payer: Self-pay | Admitting: Family Medicine

## 2021-10-10 DIAGNOSIS — M25521 Pain in right elbow: Secondary | ICD-10-CM

## 2021-10-11 ENCOUNTER — Other Ambulatory Visit (HOSPITAL_BASED_OUTPATIENT_CLINIC_OR_DEPARTMENT_OTHER): Payer: Self-pay

## 2021-10-11 ENCOUNTER — Encounter: Payer: Self-pay | Admitting: Family Medicine

## 2021-10-11 ENCOUNTER — Ambulatory Visit (INDEPENDENT_AMBULATORY_CARE_PROVIDER_SITE_OTHER): Payer: No Typology Code available for payment source | Admitting: Family Medicine

## 2021-10-11 VITALS — BP 122/80 | HR 98 | Temp 97.9°F | Ht 65.0 in | Wt 163.8 lb

## 2021-10-11 DIAGNOSIS — Z23 Encounter for immunization: Secondary | ICD-10-CM | POA: Diagnosis not present

## 2021-10-11 DIAGNOSIS — N281 Cyst of kidney, acquired: Secondary | ICD-10-CM

## 2021-10-11 DIAGNOSIS — M5431 Sciatica, right side: Secondary | ICD-10-CM | POA: Diagnosis not present

## 2021-10-11 MED ORDER — PREDNISONE 20 MG PO TABS
ORAL_TABLET | ORAL | 0 refills | Status: DC
  Filled 2021-10-11: qty 15, 10d supply, fill #0

## 2021-10-11 MED ORDER — MELOXICAM 7.5 MG PO TABS
7.5000 mg | ORAL_TABLET | Freq: Every day | ORAL | 0 refills | Status: DC
  Filled 2021-10-11: qty 30, 30d supply, fill #0

## 2021-10-11 NOTE — Patient Instructions (Signed)
It was good to see you today!  Please let me know how the prednisone works for your pain ? ?Please do your x-rays and ultrasound at [facility] - I might give them a call today to see if you can schedule to ultrasound. X-rays can be done walk-in ? ?Address: [address], [place],  [address] ?Phone: [phone] ? ? ?

## 2021-10-11 NOTE — Telephone Encounter (Signed)
Pt is requesting Meloxicam. Not on med list and was last seen 03/05/21 ?

## 2021-10-11 NOTE — Progress Notes (Signed)
[facility]rap[facility], [facility] at [facility] ?[address], [address] ?[place], [place] [address] ?336 [phone] ?Fax [phone] ? ?Date:  10/11/2021  ? ?Name:  Elizabeth Dennis   DOB:  [birth year]   MRN:  [id number] ? ?PCP:  Darreld Mclean, MD  ? ? ?Chief Complaint: right hip pain (Going on since xmas and it is not getting any better) ? ? ?History of Present Illness: ? ?Elizabeth Dennis is a 61 y.o. very pleasant female patient who presents with the following: ? ?Patient whom I last saw in August.  Here today with concern of leg pain ?History of hypothyroidism ? ?Tetanus is due for update ?Most recent lab work done in June, thyroid in August ? ?Today she notes soreness in her tailbone and pain in her right hip- this has been going on for about 3 months now ?She has tried stretching but this seems to make her pain worse sometimes ?She has tried meloxicam and tried a flexeril ?She is more comfortable when she is laying down at night ?She feels a tight feeling coming down her leg to knee level ?Sometimes her leg may tingle down to her toes and may go numb sometimes ? ?No injury that she can recall  ?The left leg is ok ?Her back is not really hurting-the symptoms are more in her leg ?She did get a new mattress but unfortunately it did not help ? ?She did have an MRI back in 2018  ?MRI done 08/06/16- ?1. Small diffuse disc bulge at L4-L5 eccentric to the left which abuts the exiting left L4 nerve within the distal foramen. Small broad-based left paracentral protrusion at L5-S1 which abuts but does not significantly displace the descending left S1  ?nerve root. Correlate with radicular symptoms. ?2. Mild levocurvature centered at L2-L3. ?3. Small T2 hyperintense lesion within the left kidney, T2 hyperintense lesion within liver, these lesions are not fully evaluated, suggest dedicated abdominal ultrasound. ? ? ?Patient Active Problem List  ? Diagnosis Date Noted  ? Acute sinusitis 01/31/2021  ? Cellulitis of right breast  11/13/2020  ? Acquired hypothyroidism 09/13/2020  ? Right leg paresthesias 04/03/2017  ? ? ?Past Medical History:  ?Diagnosis Date  ? Acquired hypothyroidism 09/13/2020  ? Allergy   ? ? ?Past Surgical History:  ?Procedure Laterality Date  ? CESAREAN SECTION    ? ? ?Social History  ? ?Tobacco Use  ? Smoking status: Never  ? Smokeless tobacco: Never  ?Substance Use Topics  ? Alcohol use: No  ?  Alcohol/week: 0.0 standard drinks  ? Drug use: No  ? ? ?Family History  ?Problem Relation Age of Onset  ? Breast cancer Mother   ? Cervical cancer Mother   ? COPD Mother   ? Hypertension Mother   ? COPD Father   ? ? ?Allergies  ?Allergen Reactions  ? Penicillins Hives  ?  Childhood - ? Hives  ? ? ?Medication list has been reviewed and updated. ? ?Current Outpatient Medications on File Prior to Visit  ?Medication Sig Dispense Refill  ? levothyroxine (SYNTHROID) 50 MCG tablet Take 1 tablet (50 mcg total) by mouth daily before breakfast. 30 tablet 3  ? meloxicam (MOBIC) 7.5 MG tablet Take 1 tablet (7.5 mg total) by mouth daily as needed 30 tablet 0  ? pantoprazole (PROTONIX) 40 MG tablet Take 1 tablet (40 mg total) by mouth daily. Office visit for further refills 30 tablet 2  ? ?No current facility-administered medications on file prior  to visit.  ? ? ?Review of Systems: ? ?As per HPI- otherwise negative. ? ? ?Physical Examination: ?Vitals:  ? 10/11/21 1256  ?BP: 122/80  ?Pulse: 98  ?Temp: 97.9 ?F (36.6 ?C)  ?SpO2: 97%  ? ?Vitals:  ? 10/11/21 1256  ?Weight: 163 lb 12.8 oz (74.3 kg)  ?Height: '5\' 5"'$  (1.651 m)  ? ?Body mass index is 27.26 kg/m?. ?Ideal Body Weight: Weight in (lb) to have BMI = 25: 149.9 ? ?GEN: no acute distress.  Mild overweight, looks well ?HEENT: Atraumatic, Normocephalic.  ?Ears and Nose: No external deformity. ?CV: RRR, No M/G/R. No JVD. No thrill. No extra heart sounds. ?PULM: CTA B, no wheezes, crackles, rhonchi. No retractions. No resp. distress. No accessory muscle use. ?ABD: S, NT, ND, +BS. No rebound. No  HSM. ?EXTR: No c/c/e ?PSYCH: Normally interactive. Conversant.  ?Thoracolumbar flexion is limited due to back pain.  Patient notes pain from the right sciatic notch ?Normal bilateral lower extremity strength and sensation.  Normal deep tendon reflex.  Negative straight leg raise ?Manipulation of right hip does not reproduce pain ? ?Assessment and Plan: ?Sciatica, right side - Plan: DG Hip Unilat W OR W/O Pelvis 2-3 Views Right, DG Lumbar Spine Complete, predniSONE (DELTASONE) 20 MG tablet ? ?Renal cyst - Plan: US Abdomen Complete ? ?Immunization due - Plan: Td vaccine greater than or equal to 7yo preservative free IM ? ?Patient seen today with concern of numbness and pain in her right leg, most consistent with sciatica.  She does have history of bulging lumbar disc seen on MRI 2018.  We will plan to get lumbar plain films today, and we will also x-ray her hip to evaluate for any arthritis.  We will start her on prednisone ? ?MRI back in 2018 mentions potential liver and kidney lesions.  These were not evaluated at the time of her MRI.  Ordered ultrasound for her today ? ?Signed ?Lamar Blinks, MD ? ?

## 2021-10-23 ENCOUNTER — Other Ambulatory Visit: Payer: Self-pay

## 2021-10-23 ENCOUNTER — Ambulatory Visit
Admission: RE | Admit: 2021-10-23 | Discharge: 2021-10-23 | Disposition: A | Discharge status: Home / Self Care | Payer: No Typology Code available for payment source | Source: Ambulatory Visit | Attending: Family Medicine | Admitting: Family Medicine

## 2021-10-23 DIAGNOSIS — M5431 Sciatica, right side: Secondary | ICD-10-CM

## 2021-10-23 DIAGNOSIS — N281 Cyst of kidney, acquired: Secondary | ICD-10-CM

## 2021-12-13 ENCOUNTER — Encounter: Payer: Self-pay | Admitting: Family Medicine

## 2021-12-13 DIAGNOSIS — M5431 Sciatica, right side: Secondary | ICD-10-CM

## 2021-12-20 ENCOUNTER — Other Ambulatory Visit (HOSPITAL_BASED_OUTPATIENT_CLINIC_OR_DEPARTMENT_OTHER): Payer: Self-pay

## 2021-12-20 ENCOUNTER — Other Ambulatory Visit: Payer: Self-pay | Admitting: Family Medicine

## 2021-12-20 DIAGNOSIS — M25521 Pain in right elbow: Secondary | ICD-10-CM

## 2021-12-24 ENCOUNTER — Other Ambulatory Visit (HOSPITAL_BASED_OUTPATIENT_CLINIC_OR_DEPARTMENT_OTHER): Payer: Self-pay

## 2021-12-24 MED ORDER — MELOXICAM 7.5 MG PO TABS
7.5000 mg | ORAL_TABLET | Freq: Every day | ORAL | 0 refills | Status: DC
  Filled 2021-12-24: qty 30, 30d supply, fill #0

## 2021-12-27 NOTE — Addendum Note (Signed)
Addended by: Lamar Blinks C on: 12/27/2021 02:47 PM   Modules accepted: Orders

## 2022-01-01 ENCOUNTER — Telehealth: Payer: Self-pay

## 2022-01-01 NOTE — Telephone Encounter (Signed)
Approval number: [id number] Approval dates: 12/27/21 - 01/26/22

## 2022-01-07 ENCOUNTER — Ambulatory Visit (HOSPITAL_COMMUNITY): Payer: No Typology Code available for payment source

## 2022-01-16 ENCOUNTER — Ambulatory Visit (HOSPITAL_COMMUNITY)
Admission: RE | Admit: 2022-01-16 | Discharge: 2022-01-16 | Disposition: A | Discharge status: Home / Self Care | Payer: No Typology Code available for payment source | Source: Ambulatory Visit | Attending: Family Medicine | Admitting: Family Medicine

## 2022-01-16 DIAGNOSIS — M5431 Sciatica, right side: Secondary | ICD-10-CM | POA: Diagnosis present

## 2022-01-18 ENCOUNTER — Encounter: Payer: Self-pay | Admitting: Family Medicine

## 2022-01-25 ENCOUNTER — Other Ambulatory Visit (HOSPITAL_BASED_OUTPATIENT_CLINIC_OR_DEPARTMENT_OTHER): Payer: Self-pay

## 2022-05-19 NOTE — Progress Notes (Addendum)
[facility] [address], [place], [place] [address] [phone]  Date:  05/23/2022   Name:  Elizabeth Dennis   DOB:  [birth year]   MRN:  [id number]  PCP:  Darreld Mclean, MD    Chief Complaint: Annual Exam (Concerns/ questions: yes/Flu shot today: yes/)   History of Present Illness:  Elizabeth Dennis is a 61 y.o. very pleasant female patient who presents with the following:  Patient seen today for physical exam-history of hypothyroidism Most recent visit with myself March of this year  At that time she was having some issues with her back-we had a lumbar MRI in June and I offered to have her see neurosurgery.  Her symptoms got better and she elected not to follow-up further at that time Her back continues to do better!   COVID-19 recommended- done already  Flu shot- today Mammogram appears to be due- order for her today  Pap is up-to-date; repeat next year  Due to update labs today Colon cancer screening up-to-date  Levothyroxine She would also like a refill of Protonix, uses as needed I refilled Flexeril for her just recently, after she suffered a mild injury walking her dog  Patient Active Problem List   Diagnosis Date Noted   Acute sinusitis 01/31/2021   Cellulitis of right breast 11/13/2020   Acquired hypothyroidism 09/13/2020   Right leg paresthesias 04/03/2017    Past Medical History:  Diagnosis Date   Acquired hypothyroidism 09/13/2020   Allergy     Past Surgical History:  Procedure Laterality Date   CESAREAN SECTION      Social History   Tobacco Use   Smoking status: Never   Smokeless tobacco: Never  Substance Use Topics   Alcohol use: No    Alcohol/week: 0.0 standard drinks of alcohol   Drug use: No    Family History  Problem Relation Age of Onset   Breast cancer Mother    Cervical cancer Mother    COPD Mother    Hypertension Mother    COPD Father     Allergies   Allergen Reactions   Penicillins Hives    Childhood - ? Hives    Medication list has been reviewed and updated.  Current Outpatient Medications on File Prior to Visit  Medication Sig Dispense Refill   cyclobenzaprine (FLEXERIL) 10 MG tablet Take 1 tablet (10 mg total) by mouth 2 (two) times daily as needed for muscle spasms. 30 tablet 0   levothyroxine (SYNTHROID) 50 MCG tablet Take 1 tablet (50 mcg total) by mouth daily before breakfast. 30 tablet 3   meloxicam (MOBIC) 7.5 MG tablet Take 1 tablet (7.5 mg total) by mouth daily as needed 30 tablet 0   pantoprazole (PROTONIX) 40 MG tablet Take 1 tablet (40 mg total) by mouth daily. Office visit for further refills 30 tablet 2   No current facility-administered medications on file prior to visit.    Review of Systems:  As per HPI- otherwise negative.   Physical Examination: Vitals:   05/23/22 1323  BP: 110/74  Pulse: 80  Resp: 18  Temp: 97.9 F (36.6 C)  SpO2: 95%   Vitals:   05/23/22 1323  Weight: 169 lb 9.6 oz (76.9 kg)  Height: '5\' 5"'$  (1.651 m)   Body mass index is 28.22 kg/m. Ideal Body Weight: Weight in (lb) to have BMI = 25: 149.9  GEN: no acute distress.  Mild overweight, looks well  HEENT: Atraumatic, Normocephalic.  Ears and Nose: No external deformity. CV: RRR, No M/G/R. No JVD. No thrill. No extra heart sounds. PULM: CTA B, no wheezes, crackles, rhonchi. No retractions. No resp. distress. No accessory muscle use. ABD: S, NT, ND, +BS. No rebound. No HSM. EXTR: No c/c/e PSYCH: Normally interactive. Conversant.   Assessment and Plan: Physical exam  Hypothyroidism, unspecified type - Plan: TSH  Screening for diabetes mellitus - Plan: Comprehensive metabolic panel, Hemoglobin A1c  Screening for deficiency anemia - Plan: CBC  Screening for hyperlipidemia - Plan: Lipid panel  Fatigue, unspecified type - Plan: VITAMIN D 25 Hydroxy (Vit-D Deficiency, Fractures)  Need for influenza vaccination - Plan: Flu  Vaccine QUAD 6+ mos PF IM (Fluarix Quad PF)  Right elbow pain - Plan: meloxicam (MOBIC) 7.5 MG tablet  Encounter for screening mammogram for malignant neoplasm of breast - Plan: MM 3D SCREEN BREAST BILATERAL  Gastroesophageal reflux disease, unspecified whether esophagitis present - Plan: pantoprazole (PROTONIX) 40 MG tablet  Physical exam today.  Encouraged healthy diet and exercise routine Will plan further follow- up pending labs. Flu vaccine today, recommended dose of RSV Order mammogram Will plan further follow- up pending labs.   Signed Lamar Blinks, MD  Addendum 10/20, received labs as below.  Message to patient Results for orders placed or performed in visit on 05/23/22  CBC  Result Value Ref Range   WBC 5.4 4.0 - 10.5 K/uL   RBC 4.49 3.87 - 5.11 Mil/uL   Platelets 246.0 150.0 - 400.0 K/uL   Hemoglobin 13.6 12.0 - 15.0 g/dL   HCT 40.6 36.0 - 46.0 %   MCV 90.5 78.0 - 100.0 fl   MCHC 33.4 30.0 - 36.0 g/dL   RDW 13.0 11.5 - 15.5 %  Comprehensive metabolic panel  Result Value Ref Range   Sodium 142 135 - 145 mEq/L   Potassium 3.8 3.5 - 5.1 mEq/L   Chloride 105 96 - 112 mEq/L   CO2 26 19 - 32 mEq/L   Glucose, Bld 88 70 - 99 mg/dL   BUN 21 6 - 23 mg/dL   Creatinine, Ser 0.87 0.40 - 1.20 mg/dL   Total Bilirubin 0.5 0.2 - 1.2 mg/dL   Alkaline Phosphatase 74 39 - 117 U/L   AST 19 0 - 37 U/L   ALT 19 0 - 35 U/L   Total Protein 6.8 6.0 - 8.3 g/dL   Albumin 4.3 3.5 - 5.2 g/dL   GFR 71.86 >60.00 mL/min   Calcium 9.4 8.4 - 10.5 mg/dL  Hemoglobin A1c  Result Value Ref Range   Hgb A1c MFr Bld 5.8 4.6 - 6.5 %  Lipid panel  Result Value Ref Range   Cholesterol 211 (H) 0 - 200 mg/dL   Triglycerides 161.0 (H) 0.0 - 149.0 mg/dL   HDL 46.20 >39.00 mg/dL   VLDL 32.2 0.0 - 40.0 mg/dL   LDL Cholesterol 132 (H) 0 - 99 mg/dL   Total CHOL/HDL Ratio 5    NonHDL 164.45   TSH  Result Value Ref Range   TSH 2.99 0.35 - 5.50 uIU/mL  VITAMIN D 25 Hydroxy (Vit-D Deficiency,  Fractures)  Result Value Ref Range   VITD 25.68 (L) 30.00 - 100.00 ng/mL

## 2022-05-19 NOTE — Patient Instructions (Signed)
It was good to see you again today, I will be in touch with your labs as soon as possible Recommend COVID-19, consider dose of RSV this fall

## 2022-05-21 ENCOUNTER — Other Ambulatory Visit (HOSPITAL_BASED_OUTPATIENT_CLINIC_OR_DEPARTMENT_OTHER): Payer: Self-pay

## 2022-05-21 ENCOUNTER — Encounter: Payer: Self-pay | Admitting: Family Medicine

## 2022-05-21 ENCOUNTER — Other Ambulatory Visit: Payer: Self-pay | Admitting: Internal Medicine

## 2022-05-21 DIAGNOSIS — M25521 Pain in right elbow: Secondary | ICD-10-CM

## 2022-05-21 MED ORDER — CYCLOBENZAPRINE HCL 10 MG PO TABS
10.0000 mg | ORAL_TABLET | Freq: Two times a day (BID) | ORAL | 0 refills | Status: DC | PRN
  Filled 2022-05-21: qty 30, 15d supply, fill #0

## 2022-05-21 MED ORDER — COMIRNATY 30 MCG/0.3ML IM SUSY
PREFILLED_SYRINGE | INTRAMUSCULAR | 0 refills | Status: DC
  Filled 2022-05-21: qty 0.3, 1d supply, fill #0

## 2022-05-21 MED ORDER — PANTOPRAZOLE SODIUM 40 MG PO TBEC
40.0000 mg | DELAYED_RELEASE_TABLET | Freq: Every day | ORAL | 2 refills | Status: DC
  Filled 2022-05-21: qty 30, 30d supply, fill #0

## 2022-05-21 NOTE — Telephone Encounter (Signed)
Pt has a pending appointment on 05/23/22. Okay for Rx?

## 2022-05-21 NOTE — Telephone Encounter (Signed)
Patient needs an appointment for future refills. 

## 2022-05-23 ENCOUNTER — Other Ambulatory Visit (HOSPITAL_BASED_OUTPATIENT_CLINIC_OR_DEPARTMENT_OTHER): Payer: Self-pay

## 2022-05-23 ENCOUNTER — Encounter: Payer: Self-pay | Admitting: Family Medicine

## 2022-05-23 ENCOUNTER — Ambulatory Visit (INDEPENDENT_AMBULATORY_CARE_PROVIDER_SITE_OTHER): Payer: No Typology Code available for payment source | Admitting: Family Medicine

## 2022-05-23 VITALS — BP 110/74 | HR 80 | Temp 97.9°F | Resp 18 | Ht 65.0 in | Wt 169.6 lb

## 2022-05-23 DIAGNOSIS — Z13220 Encounter for screening for lipoid disorders: Secondary | ICD-10-CM

## 2022-05-23 DIAGNOSIS — Z23 Encounter for immunization: Secondary | ICD-10-CM | POA: Diagnosis not present

## 2022-05-23 DIAGNOSIS — E039 Hypothyroidism, unspecified: Secondary | ICD-10-CM

## 2022-05-23 DIAGNOSIS — R7303 Prediabetes: Secondary | ICD-10-CM

## 2022-05-23 DIAGNOSIS — Z131 Encounter for screening for diabetes mellitus: Secondary | ICD-10-CM | POA: Diagnosis not present

## 2022-05-23 DIAGNOSIS — M25521 Pain in right elbow: Secondary | ICD-10-CM

## 2022-05-23 DIAGNOSIS — Z130 Encounter for screening for diseases of the blood and blood-forming organs and certain disorders involving the immune mechanism: Secondary | ICD-10-CM | POA: Diagnosis not present

## 2022-05-23 DIAGNOSIS — E785 Hyperlipidemia, unspecified: Secondary | ICD-10-CM

## 2022-05-23 DIAGNOSIS — Z1231 Encounter for screening mammogram for malignant neoplasm of breast: Secondary | ICD-10-CM

## 2022-05-23 DIAGNOSIS — K219 Gastro-esophageal reflux disease without esophagitis: Secondary | ICD-10-CM

## 2022-05-23 DIAGNOSIS — R5383 Other fatigue: Secondary | ICD-10-CM | POA: Diagnosis not present

## 2022-05-23 DIAGNOSIS — Z0000 Encounter for general adult medical examination without abnormal findings: Secondary | ICD-10-CM

## 2022-05-23 DIAGNOSIS — E559 Vitamin D deficiency, unspecified: Secondary | ICD-10-CM

## 2022-05-23 MED ORDER — PANTOPRAZOLE SODIUM 40 MG PO TBEC
40.0000 mg | DELAYED_RELEASE_TABLET | Freq: Every day | ORAL | 1 refills | Status: DC
  Filled 2022-05-23 – 2022-08-02 (×2): qty 90, 90d supply, fill #0

## 2022-05-23 MED ORDER — MELOXICAM 7.5 MG PO TABS
7.5000 mg | ORAL_TABLET | Freq: Every day | ORAL | 1 refills | Status: DC
  Filled 2022-05-23: qty 90, 90d supply, fill #0

## 2022-05-24 ENCOUNTER — Encounter: Payer: Self-pay | Admitting: Family Medicine

## 2022-05-24 DIAGNOSIS — R7303 Prediabetes: Secondary | ICD-10-CM | POA: Insufficient documentation

## 2022-05-24 DIAGNOSIS — E559 Vitamin D deficiency, unspecified: Secondary | ICD-10-CM | POA: Insufficient documentation

## 2022-05-24 DIAGNOSIS — E785 Hyperlipidemia, unspecified: Secondary | ICD-10-CM | POA: Insufficient documentation

## 2022-05-24 LAB — LIPID PANEL
Cholesterol: 211 mg/dL — ABNORMAL HIGH (ref 0–200)
HDL: 46.2 mg/dL (ref >39.00)
LDL Cholesterol: 132 mg/dL — ABNORMAL HIGH (ref 0–99)
NonHDL: 164.45
Total CHOL/HDL Ratio: 5
Triglycerides: 161 mg/dL — ABNORMAL HIGH (ref 0.0–149.0)
VLDL: 32.2 mg/dL (ref 0.0–40.0)

## 2022-05-24 LAB — COMPREHENSIVE METABOLIC PANEL
ALT: 19 U/L (ref 0–35)
AST: 19 U/L (ref 0–37)
Albumin: 4.3 g/dL (ref 3.5–5.2)
Alkaline Phosphatase: 74 U/L (ref 39–117)
BUN: 21 mg/dL (ref 6–23)
CO2: 26 mEq/L (ref 19–32)
Calcium: 9.4 mg/dL (ref 8.4–10.5)
Chloride: 105 mEq/L (ref 96–112)
Creatinine, Ser: 0.87 mg/dL (ref 0.40–1.20)
GFR: 71.86 mL/min (ref >60.00)
Glucose, Bld: 88 mg/dL (ref 70–99)
Potassium: 3.8 mEq/L (ref 3.5–5.1)
Sodium: 142 mEq/L (ref 135–145)
Total Bilirubin: 0.5 mg/dL (ref 0.2–1.2)
Total Protein: 6.8 g/dL (ref 6.0–8.3)

## 2022-05-24 LAB — CBC
HCT: 40.6 % (ref 36.0–46.0)
Hemoglobin: 13.6 g/dL (ref 12.0–15.0)
MCHC: 33.4 g/dL (ref 30.0–36.0)
MCV: 90.5 fl (ref 78.0–100.0)
Platelets: 246 10*3/uL (ref 150.0–400.0)
RBC: 4.49 Mil/uL (ref 3.87–5.11)
RDW: 13 % (ref 11.5–15.5)
WBC: 5.4 10*3/uL (ref 4.0–10.5)

## 2022-05-24 LAB — HEMOGLOBIN A1C: Hgb A1c MFr Bld: 5.8 % (ref 4.6–6.5)

## 2022-05-24 LAB — TSH: TSH: 2.99 u[IU]/mL (ref 0.35–5.50)

## 2022-05-24 LAB — VITAMIN D 25 HYDROXY (VIT D DEFICIENCY, FRACTURES): VITD: 25.68 ng/mL — ABNORMAL LOW (ref 30.00–100.00)

## 2022-06-11 ENCOUNTER — Other Ambulatory Visit (HOSPITAL_BASED_OUTPATIENT_CLINIC_OR_DEPARTMENT_OTHER): Payer: Self-pay

## 2022-06-11 ENCOUNTER — Encounter: Payer: Self-pay | Admitting: Family Medicine

## 2022-06-11 DIAGNOSIS — M79601 Pain in right arm: Secondary | ICD-10-CM

## 2022-06-11 MED ORDER — PREDNISONE 10 MG PO TABS
ORAL_TABLET | ORAL | 0 refills | Status: DC
  Filled 2022-06-11: qty 21, 6d supply, fill #0

## 2022-06-11 MED ORDER — LIDOCAINE 5 % EX PTCH
1.0000 | MEDICATED_PATCH | Freq: Every day | CUTANEOUS | 0 refills | Status: DC
  Filled 2022-06-11: qty 30, 30d supply, fill #0

## 2022-06-11 MED ORDER — GABAPENTIN 100 MG PO CAPS
100.0000 mg | ORAL_CAPSULE | Freq: Every day | ORAL | 0 refills | Status: DC
  Filled 2022-06-11: qty 10, 10d supply, fill #0

## 2022-06-11 MED ORDER — METHOCARBAMOL 500 MG PO TABS
500.0000 mg | ORAL_TABLET | Freq: Two times a day (BID) | ORAL | 0 refills | Status: DC
  Filled 2022-06-11: qty 20, 10d supply, fill #0

## 2022-06-11 NOTE — Addendum Note (Signed)
Addended by: Lamar Blinks C on: 06/11/2022 02:39 PM   Modules accepted: Orders

## 2022-06-11 NOTE — Telephone Encounter (Signed)
Pt would like a referral to ortho.

## 2022-06-13 ENCOUNTER — Telehealth (HOSPITAL_BASED_OUTPATIENT_CLINIC_OR_DEPARTMENT_OTHER): Payer: Self-pay

## 2022-06-18 ENCOUNTER — Ambulatory Visit: Payer: No Typology Code available for payment source | Admitting: Orthopaedic Surgery

## 2022-07-02 ENCOUNTER — Other Ambulatory Visit (HOSPITAL_COMMUNITY): Payer: Self-pay

## 2022-07-02 ENCOUNTER — Other Ambulatory Visit (HOSPITAL_COMMUNITY): Payer: Self-pay | Admitting: Anesthesiology

## 2022-07-02 ENCOUNTER — Other Ambulatory Visit (HOSPITAL_BASED_OUTPATIENT_CLINIC_OR_DEPARTMENT_OTHER): Payer: Self-pay

## 2022-07-02 DIAGNOSIS — M5410 Radiculopathy, site unspecified: Secondary | ICD-10-CM

## 2022-07-02 MED ORDER — PREGABALIN 50 MG PO CAPS
50.0000 mg | ORAL_CAPSULE | Freq: Two times a day (BID) | ORAL | 1 refills | Status: DC
  Filled 2022-07-02: qty 60, 30d supply, fill #0

## 2022-07-02 MED ORDER — METHOCARBAMOL 500 MG PO TABS
500.0000 mg | ORAL_TABLET | Freq: Every evening | ORAL | 0 refills | Status: DC
  Filled 2022-07-02: qty 30, 30d supply, fill #0

## 2022-07-03 ENCOUNTER — Encounter: Payer: Self-pay | Admitting: Family Medicine

## 2022-07-03 ENCOUNTER — Ambulatory Visit (HOSPITAL_COMMUNITY)
Admission: RE | Admit: 2022-07-03 | Discharge: 2022-07-03 | Disposition: A | Discharge status: Home / Self Care | Payer: No Typology Code available for payment source | Source: Ambulatory Visit | Attending: Anesthesiology | Admitting: Anesthesiology

## 2022-07-03 DIAGNOSIS — M5410 Radiculopathy, site unspecified: Secondary | ICD-10-CM | POA: Insufficient documentation

## 2022-07-03 DIAGNOSIS — M5412 Radiculopathy, cervical region: Secondary | ICD-10-CM

## 2022-07-09 ENCOUNTER — Other Ambulatory Visit (HOSPITAL_COMMUNITY): Payer: Self-pay

## 2022-07-09 MED ORDER — GABAPENTIN 300 MG PO CAPS
300.0000 mg | ORAL_CAPSULE | Freq: Every evening | ORAL | 1 refills | Status: DC
  Filled 2022-07-09 – 2022-07-10 (×2): qty 30, 30d supply, fill #0

## 2022-07-10 ENCOUNTER — Other Ambulatory Visit (HOSPITAL_BASED_OUTPATIENT_CLINIC_OR_DEPARTMENT_OTHER): Payer: Self-pay

## 2022-07-20 ENCOUNTER — Other Ambulatory Visit (HOSPITAL_COMMUNITY): Payer: Self-pay

## 2022-07-31 ENCOUNTER — Other Ambulatory Visit (HOSPITAL_COMMUNITY): Payer: Self-pay

## 2022-08-02 ENCOUNTER — Other Ambulatory Visit (HOSPITAL_BASED_OUTPATIENT_CLINIC_OR_DEPARTMENT_OTHER): Payer: Self-pay

## 2022-08-02 ENCOUNTER — Other Ambulatory Visit: Payer: Self-pay | Admitting: Family Medicine

## 2022-08-05 ENCOUNTER — Other Ambulatory Visit (HOSPITAL_BASED_OUTPATIENT_CLINIC_OR_DEPARTMENT_OTHER): Payer: Self-pay

## 2022-08-06 ENCOUNTER — Other Ambulatory Visit (HOSPITAL_BASED_OUTPATIENT_CLINIC_OR_DEPARTMENT_OTHER): Payer: Self-pay

## 2022-08-06 MED ORDER — LEVOTHYROXINE SODIUM 50 MCG PO TABS
50.0000 ug | ORAL_TABLET | Freq: Every day | ORAL | 3 refills | Status: DC
  Filled 2022-08-06: qty 30, 30d supply, fill #0

## 2022-08-07 ENCOUNTER — Other Ambulatory Visit (HOSPITAL_BASED_OUTPATIENT_CLINIC_OR_DEPARTMENT_OTHER): Payer: Self-pay

## 2022-08-07 ENCOUNTER — Other Ambulatory Visit: Payer: Self-pay

## 2022-08-08 ENCOUNTER — Other Ambulatory Visit (HOSPITAL_BASED_OUTPATIENT_CLINIC_OR_DEPARTMENT_OTHER): Payer: Self-pay

## 2022-08-08 DIAGNOSIS — M5412 Radiculopathy, cervical region: Secondary | ICD-10-CM | POA: Diagnosis not present

## 2022-08-08 DIAGNOSIS — Z6828 Body mass index (BMI) 28.0-28.9, adult: Secondary | ICD-10-CM | POA: Diagnosis not present

## 2022-08-08 MED ORDER — GABAPENTIN 300 MG PO CAPS
300.0000 mg | ORAL_CAPSULE | Freq: Every day | ORAL | 1 refills | Status: DC
  Filled 2022-08-08: qty 30, 30d supply, fill #0

## 2022-10-30 ENCOUNTER — Encounter: Payer: Self-pay | Admitting: Family Medicine

## 2022-10-30 NOTE — Telephone Encounter (Signed)
Please advise at your convenience. Pt is aware that you are away as this is not urgent.

## 2022-11-06 DIAGNOSIS — Z85828 Personal history of other malignant neoplasm of skin: Secondary | ICD-10-CM | POA: Diagnosis not present

## 2022-11-06 DIAGNOSIS — L820 Inflamed seborrheic keratosis: Secondary | ICD-10-CM | POA: Diagnosis not present

## 2022-11-24 NOTE — Progress Notes (Unsigned)
[facility] [address] [place], [place] [address] [phone]  Date:  11/27/2022   Name:  Elizabeth Dennis   DOB:  04/01/1961   MRN:  [id number]  PCP:  Pearline Cables, MD    Chief Complaint: No chief complaint on file.   History of Present Illness:  Elizabeth Dennis is a 62 y.o. very pleasant female patient who presents with the following:  Patient seen today for physical exam Most recent visit with myself was in October------------------- confirm with patient that she wishes to do physical exam again today History of dyslipidemia, hypothyroidism, prediabetes, vitamin D deficiency  She has recently been dealing with cervical radiculopathy, has been seen by [facility] as well as neurosurgery  Mammogram Pap smear up-to-date Bone density Colon cancer screening up-to-date  Complete labs done in October-minimally low vitamin D, A1c 5.8%  Patient Active Problem List   Diagnosis Date Noted   Prediabetes 05/24/2022   Dyslipidemia 05/24/2022   Vitamin D deficiency 05/24/2022   Acute sinusitis 01/31/2021   Cellulitis of right breast 11/13/2020   Acquired hypothyroidism 09/13/2020   Right leg paresthesias 04/03/2017    Past Medical History:  Diagnosis Date   Acquired hypothyroidism 09/13/2020   Allergy     Past Surgical History:  Procedure Laterality Date   CESAREAN SECTION      Social History   Tobacco Use   Smoking status: Never   Smokeless tobacco: Never  Substance Use Topics   Alcohol use: No    Alcohol/week: 0.0 standard drinks of alcohol   Drug use: No    Family History  Problem Relation Age of Onset   Breast cancer Mother    Cervical cancer Mother    COPD Mother    Hypertension Mother    COPD Father     Allergies  Allergen Reactions   Penicillins Hives    Childhood - ? Hives    Medication list has been reviewed and updated.  Current Outpatient Medications on File  Prior to Visit  Medication Sig Dispense Refill   cyclobenzaprine (FLEXERIL) 10 MG tablet Take 1 tablet (10 mg total) by mouth 2 (two) times daily as needed for muscle spasms. 30 tablet 0   gabapentin (NEURONTIN) 100 MG capsule Take 1 capsule (100 mg total) by mouth at bedtime. 10 capsule 0   gabapentin (NEURONTIN) 300 MG capsule Take 1 capsule (300 mg total) by mouth at bedtime. 30 capsule 1   gabapentin (NEURONTIN) 300 MG capsule Take 1 capsule (300 mg total) by mouth at bedtime. 30 capsule 1   levothyroxine (SYNTHROID) 50 MCG tablet Take 1 tablet (50 mcg total) by mouth daily before breakfast. 30 tablet 3   lidocaine (LIDODERM) 5 % Place 1 patch onto the skin daily. (may wear up to 12 hours) 30 patch 0   meloxicam (MOBIC) 7.5 MG tablet Take 1 tablet (7.5 mg total) by mouth daily as needed 90 tablet 1   methocarbamol (ROBAXIN) 500 MG tablet Take 1 tablet (500 mg total) by mouth every evening as needed. 30 tablet 0   pantoprazole (PROTONIX) 40 MG tablet Take 1 tablet (40 mg total) by mouth daily. Use as needed for GERD symptoms 90 tablet 1   predniSONE (DELTASONE) 10 MG tablet Taper over a 6-day period: 6,5,4,3,2,1 21 tablet 0   pregabalin (LYRICA) 50 MG capsule Take 1 capsule (50 mg total) by mouth in the morning and at bedtime. 60 capsule  1   No current facility-administered medications on file prior to visit.    Review of Systems:  As per HPI- otherwise negative.   Physical Examination: There were no vitals filed for this visit. There were no vitals filed for this visit. There is no height or weight on file to calculate BMI. Ideal Body Weight:    GEN: no acute distress. HEENT: Atraumatic, Normocephalic.  Ears and Nose: No external deformity. CV: RRR, No M/G/R. No JVD. No thrill. No extra heart sounds. PULM: CTA B, no wheezes, crackles, rhonchi. No retractions. No resp. distress. No accessory muscle use. ABD: S, NT, ND, +BS. No rebound. No HSM. EXTR: No c/c/e PSYCH: Normally  interactive. Conversant.    Assessment and Plan: ***  Signed Abbe Amsterdam, MD

## 2022-11-27 ENCOUNTER — Other Ambulatory Visit (HOSPITAL_BASED_OUTPATIENT_CLINIC_OR_DEPARTMENT_OTHER): Payer: Self-pay

## 2022-11-27 ENCOUNTER — Ambulatory Visit (INDEPENDENT_AMBULATORY_CARE_PROVIDER_SITE_OTHER): Payer: 59 | Admitting: Family Medicine

## 2022-11-27 ENCOUNTER — Encounter: Payer: Self-pay | Admitting: Family Medicine

## 2022-11-27 VITALS — BP 110/64 | HR 84 | Temp 98.2°F | Resp 18 | Ht 65.0 in | Wt 169.2 lb

## 2022-11-27 DIAGNOSIS — E039 Hypothyroidism, unspecified: Secondary | ICD-10-CM | POA: Diagnosis not present

## 2022-11-27 DIAGNOSIS — R7303 Prediabetes: Secondary | ICD-10-CM | POA: Diagnosis not present

## 2022-11-27 DIAGNOSIS — Z1231 Encounter for screening mammogram for malignant neoplasm of breast: Secondary | ICD-10-CM

## 2022-11-27 DIAGNOSIS — T753XXD Motion sickness, subsequent encounter: Secondary | ICD-10-CM | POA: Diagnosis not present

## 2022-11-27 DIAGNOSIS — Z130 Encounter for screening for diseases of the blood and blood-forming organs and certain disorders involving the immune mechanism: Secondary | ICD-10-CM

## 2022-11-27 DIAGNOSIS — Z0000 Encounter for general adult medical examination without abnormal findings: Secondary | ICD-10-CM | POA: Diagnosis not present

## 2022-11-27 DIAGNOSIS — E559 Vitamin D deficiency, unspecified: Secondary | ICD-10-CM

## 2022-11-27 DIAGNOSIS — E2839 Other primary ovarian failure: Secondary | ICD-10-CM

## 2022-11-27 LAB — BASIC METABOLIC PANEL
BUN: 15 mg/dL (ref 6–23)
CO2: 27 mEq/L (ref 19–32)
Calcium: 9.4 mg/dL (ref 8.4–10.5)
Chloride: 104 mEq/L (ref 96–112)
Creatinine, Ser: 0.81 mg/dL (ref 0.40–1.20)
GFR: 78.01 mL/min (ref >60.00)
Glucose, Bld: 88 mg/dL (ref 70–99)
Potassium: 4.2 mEq/L (ref 3.5–5.1)
Sodium: 140 mEq/L (ref 135–145)

## 2022-11-27 LAB — CBC
HCT: 42.5 % (ref 36.0–46.0)
Hemoglobin: 14.6 g/dL (ref 12.0–15.0)
MCHC: 34.3 g/dL (ref 30.0–36.0)
MCV: 90.2 fl (ref 78.0–100.0)
Platelets: 285 10*3/uL (ref 150.0–400.0)
RBC: 4.71 Mil/uL (ref 3.87–5.11)
RDW: 12.7 % (ref 11.5–15.5)
WBC: 4.2 10*3/uL (ref 4.0–10.5)

## 2022-11-27 LAB — VITAMIN D 25 HYDROXY (VIT D DEFICIENCY, FRACTURES): VITD: 23.02 ng/mL — ABNORMAL LOW (ref 30.00–100.00)

## 2022-11-27 LAB — TSH: TSH: 4.06 u[IU]/mL (ref 0.35–5.50)

## 2022-11-27 LAB — HEMOGLOBIN A1C: Hgb A1c MFr Bld: 5.5 % (ref 4.6–6.5)

## 2022-11-27 MED ORDER — LEVOTHYROXINE SODIUM 50 MCG PO TABS
50.0000 ug | ORAL_TABLET | Freq: Every day | ORAL | 3 refills | Status: DC
  Filled 2022-11-27: qty 90, 90d supply, fill #0

## 2022-11-27 MED ORDER — SCOPOLAMINE 1 MG/3DAYS TD PT72
1.0000 | MEDICATED_PATCH | TRANSDERMAL | 1 refills | Status: DC
  Filled 2022-11-27 – 2022-12-31 (×2): qty 10, 30d supply, fill #0

## 2022-11-27 NOTE — Patient Instructions (Signed)
Good to see you again today- have a wonderful time on your vacation!   I will be in touch with your results asap

## 2022-12-05 ENCOUNTER — Other Ambulatory Visit (HOSPITAL_BASED_OUTPATIENT_CLINIC_OR_DEPARTMENT_OTHER): Payer: Self-pay

## 2022-12-10 ENCOUNTER — Inpatient Hospital Stay (HOSPITAL_BASED_OUTPATIENT_CLINIC_OR_DEPARTMENT_OTHER): Admission: RE | Admit: 2022-12-10 | Payer: 59 | Source: Ambulatory Visit

## 2022-12-18 ENCOUNTER — Ambulatory Visit (HOSPITAL_BASED_OUTPATIENT_CLINIC_OR_DEPARTMENT_OTHER)
Admission: RE | Admit: 2022-12-18 | Discharge: 2022-12-18 | Disposition: A | Discharge status: Home / Self Care | Payer: 59 | Source: Ambulatory Visit | Attending: Family Medicine | Admitting: Family Medicine

## 2022-12-18 ENCOUNTER — Encounter (HOSPITAL_BASED_OUTPATIENT_CLINIC_OR_DEPARTMENT_OTHER): Payer: Self-pay

## 2022-12-18 ENCOUNTER — Encounter: Payer: Self-pay | Admitting: Family Medicine

## 2022-12-18 DIAGNOSIS — E2839 Other primary ovarian failure: Secondary | ICD-10-CM | POA: Insufficient documentation

## 2022-12-18 DIAGNOSIS — M810 Age-related osteoporosis without current pathological fracture: Secondary | ICD-10-CM

## 2022-12-18 DIAGNOSIS — Z1231 Encounter for screening mammogram for malignant neoplasm of breast: Secondary | ICD-10-CM | POA: Diagnosis not present

## 2022-12-18 HISTORY — DX: Age-related osteoporosis without current pathological fracture: M81.0

## 2022-12-23 ENCOUNTER — Other Ambulatory Visit (HOSPITAL_COMMUNITY): Payer: Self-pay

## 2022-12-23 MED ORDER — ALENDRONATE SODIUM 70 MG PO TABS
70.0000 mg | ORAL_TABLET | ORAL | 3 refills | Status: DC
  Filled 2022-12-23 – 2022-12-31 (×2): qty 12, 84d supply, fill #0
  Filled 2023-01-31: qty 4, 28d supply, fill #1
  Filled 2023-08-19: qty 4, 28d supply, fill #2
  Filled 2023-08-19: qty 12, 84d supply, fill #2

## 2022-12-31 ENCOUNTER — Other Ambulatory Visit (HOSPITAL_BASED_OUTPATIENT_CLINIC_OR_DEPARTMENT_OTHER): Payer: Self-pay

## 2022-12-31 ENCOUNTER — Other Ambulatory Visit (HOSPITAL_COMMUNITY): Payer: Self-pay

## 2023-01-01 ENCOUNTER — Other Ambulatory Visit (HOSPITAL_COMMUNITY): Payer: Self-pay

## 2023-01-07 ENCOUNTER — Other Ambulatory Visit (HOSPITAL_BASED_OUTPATIENT_CLINIC_OR_DEPARTMENT_OTHER): Payer: Self-pay

## 2023-01-07 MED ORDER — ESTRADIOL 0.1 MG/GM VA CREA
TOPICAL_CREAM | VAGINAL | 3 refills | Status: DC
  Filled 2023-01-07 – 2023-01-31 (×2): qty 42.5, 30d supply, fill #0

## 2023-01-07 MED ORDER — ZEPBOUND 2.5 MG/0.5ML ~~LOC~~ SOAJ
2.5000 mg | SUBCUTANEOUS | 3 refills | Status: DC
  Filled 2023-01-07: qty 2, 28d supply, fill #0

## 2023-01-27 ENCOUNTER — Other Ambulatory Visit (HOSPITAL_BASED_OUTPATIENT_CLINIC_OR_DEPARTMENT_OTHER): Payer: Self-pay

## 2023-01-29 ENCOUNTER — Encounter: Payer: Self-pay | Admitting: Internal Medicine

## 2023-01-29 ENCOUNTER — Ambulatory Visit (INDEPENDENT_AMBULATORY_CARE_PROVIDER_SITE_OTHER): Payer: 59 | Admitting: Internal Medicine

## 2023-01-29 ENCOUNTER — Other Ambulatory Visit (HOSPITAL_BASED_OUTPATIENT_CLINIC_OR_DEPARTMENT_OTHER): Payer: Self-pay

## 2023-01-29 VITALS — BP 126/84 | HR 69 | Temp 98.2°F | Resp 16 | Ht 65.0 in | Wt 166.2 lb

## 2023-01-29 DIAGNOSIS — R42 Dizziness and giddiness: Secondary | ICD-10-CM | POA: Diagnosis not present

## 2023-01-29 MED ORDER — MECLIZINE HCL 25 MG PO TABS
25.0000 mg | ORAL_TABLET | Freq: Three times a day (TID) | ORAL | 0 refills | Status: DC | PRN
  Filled 2023-01-29: qty 15, 5d supply, fill #0

## 2023-01-29 NOTE — Progress Notes (Unsigned)
   Subjective:    Patient ID: Elizabeth Dennis, female    DOB: 08/16/1960, 62 y.o.   MRN: [id number]  DOS:  01/29/2023 Type of visit - description: Acute  She went to [place] and was very busy and active. She used scopolamine patch while in there. She felt fine until 01/22/2023 when she took the plane back to the [place]. Developed some nausea, vomited twice, was able to drink plenty of fluids.  No EtOH.  Since then, remains slightly nauseated, when asked, she also is getting dizzy. The symptoms are coming together, dizziness triggered by moving her head or turning in bed. Described as a spinning sensation. No further vomiting. Nausea is not triggered by eating. No recent NSAIDs.  Also denies fever or chills No chest pain, difficulty breathing or palpitations No calf pain or swelling No diarrhea, abdominal pain or blood in the stools. No respiratory symptoms No headache, diplopia, slurred speech or motor deficits   Review of Systems See above   Past Medical History:  Diagnosis Date   Acquired hypothyroidism 09/13/2020   Allergy    Osteoporosis 12/18/2022    Past Surgical History:  Procedure Laterality Date   CESAREAN SECTION      Current Outpatient Medications  Medication Instructions   alendronate (FOSAMAX) 70 MG tablet Take 1 tablet by mouth every 7 days. Take with a full glass of water on an empty stomach.   cyclobenzaprine (FLEXERIL) 10 mg, Oral, 2 times daily PRN   estradiol (ESTRACE) 0.1 MG/GM vaginal cream Apply pea size amount into vagina daily, then twice weekly.   levothyroxine (SYNTHROID) 50 mcg, Oral, Daily before breakfast   lidocaine (LIDODERM) 5 % Place 1 patch onto the skin daily. (may wear up to 12 hours)   meclizine (ANTIVERT) 25 mg, Oral, 3 times daily PRN   meloxicam (MOBIC) 7.5 MG tablet Take 1 tablet (7.5 mg total) by mouth daily as needed   pantoprazole (PROTONIX) 40 mg, Oral, Daily, Use as needed for GERD symptoms   Zepbound 2.5 mg, Subcutaneous,  Weekly       Objective:   Physical Exam BP 126/84   Pulse 69   Temp 98.2 F (36.8 C) (Oral)   Resp 16   Ht 5\' 5"  (1.651 m)   Wt 166 lb 4 oz (75.4 kg)   SpO2 97%   BMI 27.67 kg/m  General:   Well developed, NAD, BMI noted.  HEENT:  Normocephalic . Face symmetric, atraumatic. EOMI, pupils equal and reactive to light and accommodation. Lungs:  CTA B Normal respiratory effort, no intercostal retractions, no accessory muscle use. Heart: RRR,  no murmur.  Abdomen:  Not distended, soft, non-tender. No rebound or rigidity.   Skin: Not pale. Not jaundice Lower extremities: Calves symmetric and no TTP Neurologic:  alert & oriented X3.  Speech normal, gait appropriate for age and unassisted.  Motor and DTR symmetric.  Tongue midline. Psych--  Cognition and judgment appear intact.  Cooperative with normal attention span and concentration.  Behavior appropriate. No anxious or depressed appearing.     Assessment     62 year old female, history of osteoporosis, vitamin D deficiency, presents with:  Vertigo: Although her presenting symptom is nausea, symptoms are associated with dizziness thus overall clinical picture consistent with vertigo. No red flag symptoms. Plan: Rest, fluids, Antivert. Call if not gradually better Red flag symptoms discussed with patient, stressed to seek medical attention if they surface.

## 2023-01-29 NOTE — Patient Instructions (Signed)
For the next few days, rest and drink plenty of fluids.  Take Antivert as needed for dizziness and nausea.  It may cause drowsiness.  If you are not gradually better in the next few days please let us know.  If you develop: Severe symptoms, headaches, chest pain, any strokelike symptoms like slurred speech, double vision or face numbness: Seek immediate medical attention.

## 2023-01-31 ENCOUNTER — Other Ambulatory Visit (HOSPITAL_BASED_OUTPATIENT_CLINIC_OR_DEPARTMENT_OTHER): Payer: Self-pay

## 2023-02-03 ENCOUNTER — Other Ambulatory Visit (HOSPITAL_BASED_OUTPATIENT_CLINIC_OR_DEPARTMENT_OTHER): Payer: Self-pay

## 2023-03-31 DIAGNOSIS — Z7689 Persons encountering health services in other specified circumstances: Secondary | ICD-10-CM | POA: Diagnosis not present

## 2023-04-17 ENCOUNTER — Other Ambulatory Visit (HOSPITAL_BASED_OUTPATIENT_CLINIC_OR_DEPARTMENT_OTHER): Payer: Self-pay

## 2023-04-17 DIAGNOSIS — R140 Abdominal distension (gaseous): Secondary | ICD-10-CM | POA: Diagnosis not present

## 2023-04-17 DIAGNOSIS — N959 Unspecified menopausal and perimenopausal disorder: Secondary | ICD-10-CM | POA: Diagnosis not present

## 2023-04-17 DIAGNOSIS — K219 Gastro-esophageal reflux disease without esophagitis: Secondary | ICD-10-CM | POA: Diagnosis not present

## 2023-04-17 DIAGNOSIS — R5382 Chronic fatigue, unspecified: Secondary | ICD-10-CM | POA: Diagnosis not present

## 2023-04-17 DIAGNOSIS — M810 Age-related osteoporosis without current pathological fracture: Secondary | ICD-10-CM | POA: Diagnosis not present

## 2023-04-17 DIAGNOSIS — R635 Abnormal weight gain: Secondary | ICD-10-CM | POA: Diagnosis not present

## 2023-04-17 DIAGNOSIS — M2550 Pain in unspecified joint: Secondary | ICD-10-CM | POA: Diagnosis not present

## 2023-04-17 DIAGNOSIS — R6882 Decreased libido: Secondary | ICD-10-CM | POA: Diagnosis not present

## 2023-04-17 DIAGNOSIS — N952 Postmenopausal atrophic vaginitis: Secondary | ICD-10-CM | POA: Diagnosis not present

## 2023-04-17 DIAGNOSIS — E039 Hypothyroidism, unspecified: Secondary | ICD-10-CM | POA: Diagnosis not present

## 2023-04-17 MED ORDER — ZEPBOUND 2.5 MG/0.5ML ~~LOC~~ SOAJ
2.5000 mg | SUBCUTANEOUS | 3 refills | Status: DC
  Filled 2023-04-17: qty 2, 28d supply, fill #0

## 2023-04-17 MED ORDER — VITAMIN D3 1.25 MG (50000 UT) PO CAPS
50000.0000 [IU] | ORAL_CAPSULE | ORAL | 90 refills | Status: AC
  Filled 2023-04-17: qty 12, 84d supply, fill #0

## 2023-04-17 MED ORDER — CYANOCOBALAMIN 1000 MCG/ML IJ SOLN
1000.0000 ug | INTRAMUSCULAR | 3 refills | Status: DC
  Filled 2023-04-17: qty 4, 28d supply, fill #0
  Filled 2023-05-15: qty 4, 28d supply, fill #1

## 2023-04-17 MED ORDER — LEVOTHYROXINE SODIUM 75 MCG PO TABS
75.0000 ug | ORAL_TABLET | Freq: Every day | ORAL | 1 refills | Status: DC
  Filled 2023-04-17: qty 90, 90d supply, fill #0
  Filled 2023-08-19 (×2): qty 90, 90d supply, fill #1

## 2023-04-18 ENCOUNTER — Other Ambulatory Visit (HOSPITAL_BASED_OUTPATIENT_CLINIC_OR_DEPARTMENT_OTHER): Payer: Self-pay

## 2023-04-29 DIAGNOSIS — M5412 Radiculopathy, cervical region: Secondary | ICD-10-CM | POA: Diagnosis not present

## 2023-05-01 ENCOUNTER — Other Ambulatory Visit (HOSPITAL_BASED_OUTPATIENT_CLINIC_OR_DEPARTMENT_OTHER): Payer: Self-pay

## 2023-05-15 ENCOUNTER — Other Ambulatory Visit (HOSPITAL_BASED_OUTPATIENT_CLINIC_OR_DEPARTMENT_OTHER): Payer: Self-pay

## 2023-05-15 MED ORDER — PROGESTERONE MICRONIZED 100 MG PO CAPS
100.0000 mg | ORAL_CAPSULE | Freq: Every day | ORAL | 1 refills | Status: DC
  Filled 2023-05-15: qty 90, 90d supply, fill #0

## 2023-05-15 MED ORDER — "INSULIN SYRINGE-NEEDLE U-100 25G X 1"" 1 ML MISC"
1.0000 | 2 refills | Status: AC
  Filled 2023-05-15: qty 20, 20d supply, fill #0

## 2023-05-15 MED ORDER — ESTRADIOL 0.025 MG/24HR TD PTTW
1.0000 | MEDICATED_PATCH | TRANSDERMAL | 2 refills | Status: DC
  Filled 2023-05-15: qty 24, 84d supply, fill #0

## 2023-05-15 MED ORDER — PHENTERMINE HCL 15 MG PO CAPS
15.0000 mg | ORAL_CAPSULE | Freq: Every day | ORAL | 0 refills | Status: DC
  Filled 2023-05-15: qty 30, 30d supply, fill #0

## 2023-06-04 ENCOUNTER — Other Ambulatory Visit (HOSPITAL_BASED_OUTPATIENT_CLINIC_OR_DEPARTMENT_OTHER): Payer: Self-pay

## 2023-06-04 MED ORDER — INFLUENZA VIRUS VACC SPLIT PF (FLUZONE) 0.5 ML IM SUSY
0.5000 mL | PREFILLED_SYRINGE | Freq: Once | INTRAMUSCULAR | 0 refills | Status: AC
  Filled 2023-06-04: qty 0.5, 1d supply, fill #0

## 2023-06-25 ENCOUNTER — Encounter: Payer: Self-pay | Admitting: Family Medicine

## 2023-06-26 ENCOUNTER — Other Ambulatory Visit (HOSPITAL_BASED_OUTPATIENT_CLINIC_OR_DEPARTMENT_OTHER): Payer: Self-pay

## 2023-06-26 MED ORDER — PHENTERMINE HCL 37.5 MG PO TABS
37.5000 mg | ORAL_TABLET | Freq: Every day | ORAL | 0 refills | Status: DC
  Filled 2023-06-26: qty 30, 30d supply, fill #0

## 2023-07-07 ENCOUNTER — Other Ambulatory Visit (HOSPITAL_BASED_OUTPATIENT_CLINIC_OR_DEPARTMENT_OTHER): Payer: Self-pay

## 2023-08-19 ENCOUNTER — Other Ambulatory Visit (HOSPITAL_BASED_OUTPATIENT_CLINIC_OR_DEPARTMENT_OTHER): Payer: Self-pay

## 2023-09-01 ENCOUNTER — Other Ambulatory Visit (HOSPITAL_BASED_OUTPATIENT_CLINIC_OR_DEPARTMENT_OTHER): Payer: Self-pay

## 2023-09-19 ENCOUNTER — Ambulatory Visit: Payer: Commercial Managed Care - PPO | Admitting: Physician Assistant

## 2023-09-19 ENCOUNTER — Encounter: Payer: Self-pay | Admitting: Physician Assistant

## 2023-09-19 ENCOUNTER — Other Ambulatory Visit (INDEPENDENT_AMBULATORY_CARE_PROVIDER_SITE_OTHER): Payer: Self-pay

## 2023-09-19 DIAGNOSIS — M25512 Pain in left shoulder: Secondary | ICD-10-CM

## 2023-09-19 MED ORDER — METHYLPREDNISOLONE ACETATE 40 MG/ML IJ SUSP
40.0000 mg | INTRAMUSCULAR | Status: AC | PRN
  Administered 2023-09-19: 40 mg via INTRA_ARTICULAR

## 2023-09-19 MED ORDER — LIDOCAINE HCL 1 % IJ SOLN
4.0000 mL | INTRAMUSCULAR | Status: AC | PRN
  Administered 2023-09-19: 4 mL

## 2023-09-19 NOTE — Progress Notes (Signed)
Office Visit Note   Patient: Elizabeth Dennis           Date of Birth: [birth year]           MRN: [id number] Visit Date: 09/19/2023              Requested by: Pearline Cables, MD [address] [place],  [place] [address] PCP: Pearline Cables, MD   Assessment & Plan: Visit Diagnoses:  1. Acute pain of left shoulder     Plan: Pleasant 63 year old woman with 67-month history of left shoulder pain.  She is right-hand dominant.  No particular injury.  X-rays today were fairly normal.  Findings consistent with rotator cuff tendinitis but good strength.  We talked about options she has had steroid injections that have helped her in the past she like to go forward with doing that I also gave her some home exercises to do may follow-up with me as needed  Follow-Up Instructions: Return if symptoms worsen or fail to improve.   Orders:  Orders Placed This Encounter  Procedures   XR Shoulder Left   No orders of the defined types were placed in this encounter.     Procedures: Large Joint Inj: L subacromial bursa on 09/19/2023 3:09 PM Indications: diagnostic evaluation and pain Details: 25 G 1.5 in needle  Arthrogram: No  Medications: 4 mL lidocaine 1 %; 40 mg methylPREDNISolone acetate 40 MG/ML Outcome: tolerated well, no immediate complications Procedure, treatment alternatives, risks and benefits explained, specific risks discussed. Consent was given by the patient.       Clinical Data: No additional findings.   Subjective: Chief Complaint  Patient presents with   Left Shoulder - Pain    HPI Elizabeth Dennis is a plan is a 62 year old woman who comes in today for a 61-month history of left shoulder pain.  She is right-hand dominant.  She feels like she has some decrease in motion and painful range of motion overhead and behind her back.  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  She  does have a history of a ruptured disc in her neck that had steroid injection and seem to help her quite a bit  Review of Systems  All other systems reviewed and are negative.    Objective: Vital Signs: There were no vitals taken for this visit.  Physical Exam Constitutional:      Appearance: Normal appearance.  HENT:     Head: Normocephalic.  Pulmonary:     Effort: Pulmonary effort is normal.  Skin:    General: Skin is warm and dry.  Neurological:     General: No focal deficit present.     Mental Status: She is alert and oriented to person, place, and time.  Psychiatric:        Mood and Affect: Mood normal.        Behavior: Behavior normal.     Ortho Exam Examination of her left shoulder she has full forward elevation though painful with overhead elevation.  She has good grip strength no paresthesias no step-offs or tenderness to palpation she also has pain with internal rotation behind her back.  She has a positive empty can test no real pain with external rotation some positive with speeds test.  Impingement findings are positive strength is intact Specialty Comments:  No specialty comments available.  Imaging: XR Shoulder  Left Result Date: 09/19/2023 Radiographs of her left shoulder do not demonstrate any significant arthropathy    PMFS History: Patient Active Problem List   Diagnosis Date Noted   Osteoporosis 12/18/2022   Prediabetes 05/24/2022   Dyslipidemia 05/24/2022   Vitamin D deficiency 05/24/2022   Acquired hypothyroidism 09/13/2020   Right leg paresthesias 04/03/2017   Past Medical History:  Diagnosis Date   Acquired hypothyroidism 09/13/2020   Allergy    Osteoporosis 12/18/2022    Family History  Problem Relation Age of Onset   Breast cancer Mother    Cervical cancer Mother    COPD Mother    Hypertension Mother    COPD Father     Past Surgical History:  Procedure Laterality Date   CESAREAN SECTION     Social History   Occupational  History   Occupation: accounts  Tobacco Use   Smoking status: Never   Smokeless tobacco: Never  Substance and Sexual Activity   Alcohol use: No    Alcohol/week: 0.0 standard drinks of alcohol   Drug use: No   Sexual activity: Not on file

## 2023-10-01 ENCOUNTER — Other Ambulatory Visit (HOSPITAL_BASED_OUTPATIENT_CLINIC_OR_DEPARTMENT_OTHER): Payer: Self-pay

## 2023-10-01 DIAGNOSIS — Z733 Stress, not elsewhere classified: Secondary | ICD-10-CM | POA: Diagnosis not present

## 2023-10-01 DIAGNOSIS — E559 Vitamin D deficiency, unspecified: Secondary | ICD-10-CM | POA: Diagnosis not present

## 2023-10-01 DIAGNOSIS — N951 Menopausal and female climacteric states: Secondary | ICD-10-CM | POA: Diagnosis not present

## 2023-10-01 DIAGNOSIS — R635 Abnormal weight gain: Secondary | ICD-10-CM | POA: Diagnosis not present

## 2023-10-01 DIAGNOSIS — M2550 Pain in unspecified joint: Secondary | ICD-10-CM | POA: Diagnosis not present

## 2023-10-01 DIAGNOSIS — K219 Gastro-esophageal reflux disease without esophagitis: Secondary | ICD-10-CM | POA: Diagnosis not present

## 2023-10-01 DIAGNOSIS — R6882 Decreased libido: Secondary | ICD-10-CM | POA: Diagnosis not present

## 2023-10-01 DIAGNOSIS — E039 Hypothyroidism, unspecified: Secondary | ICD-10-CM | POA: Diagnosis not present

## 2023-10-01 DIAGNOSIS — R5383 Other fatigue: Secondary | ICD-10-CM | POA: Diagnosis not present

## 2023-10-01 MED ORDER — PROGESTERONE 200 MG PO CAPS
200.0000 mg | ORAL_CAPSULE | Freq: Every day | ORAL | 3 refills | Status: DC
  Filled 2023-10-01: qty 90, 90d supply, fill #0

## 2023-10-01 MED ORDER — ESTRADIOL 0.0375 MG/24HR TD PTTW
1.0000 | MEDICATED_PATCH | TRANSDERMAL | 1 refills | Status: AC
  Filled 2023-10-01: qty 24, 84d supply, fill #0

## 2023-10-13 ENCOUNTER — Other Ambulatory Visit (HOSPITAL_BASED_OUTPATIENT_CLINIC_OR_DEPARTMENT_OTHER): Payer: Self-pay

## 2024-01-23 ENCOUNTER — Other Ambulatory Visit: Payer: Self-pay | Admitting: Family Medicine

## 2024-01-23 ENCOUNTER — Other Ambulatory Visit (HOSPITAL_BASED_OUTPATIENT_CLINIC_OR_DEPARTMENT_OTHER): Payer: Self-pay

## 2024-01-23 DIAGNOSIS — M810 Age-related osteoporosis without current pathological fracture: Secondary | ICD-10-CM

## 2024-01-26 ENCOUNTER — Other Ambulatory Visit (HOSPITAL_BASED_OUTPATIENT_CLINIC_OR_DEPARTMENT_OTHER): Payer: Self-pay

## 2024-01-26 ENCOUNTER — Encounter (HOSPITAL_BASED_OUTPATIENT_CLINIC_OR_DEPARTMENT_OTHER): Payer: Self-pay

## 2024-02-02 ENCOUNTER — Other Ambulatory Visit (HOSPITAL_BASED_OUTPATIENT_CLINIC_OR_DEPARTMENT_OTHER): Payer: Self-pay

## 2024-02-24 ENCOUNTER — Ambulatory Visit: Admitting: Family Medicine

## 2024-02-24 ENCOUNTER — Encounter: Payer: Self-pay | Admitting: Family Medicine

## 2024-02-24 ENCOUNTER — Ambulatory Visit: Payer: Self-pay

## 2024-02-24 ENCOUNTER — Other Ambulatory Visit (HOSPITAL_BASED_OUTPATIENT_CLINIC_OR_DEPARTMENT_OTHER): Payer: Self-pay

## 2024-02-24 VITALS — BP 124/72 | HR 78 | Temp 98.0°F | Resp 16 | Ht 65.0 in | Wt 148.0 lb

## 2024-02-24 DIAGNOSIS — M545 Low back pain, unspecified: Secondary | ICD-10-CM | POA: Diagnosis not present

## 2024-02-24 MED ORDER — MELOXICAM 15 MG PO TABS
15.0000 mg | ORAL_TABLET | Freq: Every day | ORAL | 0 refills | Status: AC
  Filled 2024-02-24: qty 30, 30d supply, fill #0

## 2024-02-24 MED ORDER — METHOCARBAMOL 500 MG PO TABS
500.0000 mg | ORAL_TABLET | Freq: Three times a day (TID) | ORAL | 0 refills | Status: AC | PRN
  Filled 2024-02-24: qty 21, 7d supply, fill #0

## 2024-02-24 NOTE — Progress Notes (Signed)
 Musculoskeletal Exam  Patient: Elizabeth Dennis DOB: [birth year]  DOS: 02/24/2024  SUBJECTIVE:  Chief Complaint:   Chief Complaint  Patient presents with   Back Pain    Low Back Pain    Elizabeth Dennis is a 63 y.o.  female for evaluation and treatment of back pain.   Onset:  2 days ago.  Was doing yard work the day before.  Location: [place] Character:  aching and shooting  Progression of issue:  got worse yesterday Associated symptoms: certain movements are quite painful Denies bowel/bladder incontinence or weakness, bruising, redness, swelling Treatment: to date has been prescription NSAIDS and oral steroids.   Neurovascular symptoms: no  Past Medical History:  Diagnosis Date   Acquired hypothyroidism 09/13/2020   Allergy    Osteoporosis 12/18/2022    Objective:  VITAL SIGNS: BP 124/72 (BP Location: Left Arm, Patient Position: Sitting)   Pulse 78   Temp 98 F (36.7 C) (Oral)   Resp 16   Ht 5' 5 (1.651 m)   Wt 148 lb (67.1 kg)   SpO2 95%   BMI 24.63 kg/m  Constitutional: Well formed, well developed. No acute distress. HENT: Normocephalic, atraumatic.  Thorax & Lungs:  No accessory muscle use Musculoskeletal: low back.   Tenderness to palpation: yes over lumbar parasp msc and ES group on R Deformity: no Ecchymosis: no Straight leg test: negative for Poor hamstring flexibility b/l. Neurologic: Normal sensory function. No focal deficits noted. DTR's equal and symmetric in LE's. No clonus. 5/5 strength throughout in LE's Psychiatric: Normal mood. Age appropriate judgment and insight. Alert & oriented x 3.    Assessment:  Acute right-sided low back pain without sciatica - Plan: meloxicam  (MOBIC ) 15 MG tablet, methocarbamol  (ROBAXIN ) 500 MG tablet  Plan: Stretches/exercises, heat, ice, Tylenol , NSAIDs, Robaxin  prn. PT if no better in the next 3-4 weeks.  F/u prn. The patient voiced understanding and agreement to the plan.   Mabel Mt [place],  DO 02/24/24  11:48 AM

## 2024-02-24 NOTE — Telephone Encounter (Signed)
 FYI Only or Action Required?: Action required by provider: request for appointment.  Patient was last seen in primary care on 01/29/2023 by Elizabeth Aloysius BRAVO, MD.  Called Nurse Triage reporting Back Pain.  Symptoms began several days ago.  Interventions attempted: Prescription medications: muscle relaxer.  Symptoms are: unchanged.Did yard work Saturday. Back started Sunday. Down right hip and leg.  Triage Disposition: See HCP Within 4 Hours (Or PCP Triage)  Patient/caregiver understands and will follow disposition?: Yes    Copied from CRM [id number]. Topic: Clinical - Red Word Triage >> Feb 24, 2024  7:52 AM Deaijah H wrote: Red Word that prompted transfer to Nurse Triage: Lot of Pain in right side of back/hip, running down leg Reason for Disposition  [1] SEVERE back pain (e.g., excruciating, unable to do any normal activities) AND [2] not improved 2 hours after pain medicine  Answer Assessment - Initial Assessment Questions 1. ONSET: When did the pain begin? (e.g., minutes, hours, days)     Sunday 2. LOCATION: Where does it hurt? (upper, mid or lower back)     Low back, right hip and down leg 3. SEVERITY: How bad is the pain?  (e.g., Scale 1-10; mild, moderate, or severe)     Now -10 4. PATTERN: Is the pain constant? (e.g., yes, no; constant, intermittent)      With movement 5. RADIATION: Does the pain shoot into your legs or somewhere else?     yes 6. CAUSE:  What do you think is causing the back pain?      unsure 7. BACK OVERUSE:  Any recent lifting of heavy objects, strenuous work or exercise?     Yard work 8. MEDICINES: What have you taken so far for the pain? (e.g., nothing, acetaminophen , NSAIDS)     Muscle relaxer 9. NEUROLOGIC SYMPTOMS: Do you have any weakness, numbness, or problems with bowel/bladder control?     no 10. OTHER SYMPTOMS: Do you have any other symptoms? (e.g., fever, abdomen pain, burning with urination, blood in urine)       no 11.  PREGNANCY: Is there any chance you are pregnant? When was your last menstrual period?       no  Protocols used: Back Pain-A-AH

## 2024-02-24 NOTE — Patient Instructions (Signed)

## 2024-03-18 NOTE — Patient Instructions (Incomplete)
 Good to see you again today

## 2024-03-18 NOTE — Progress Notes (Unsigned)
 [facility] [address], [address] [place], [place] [address] [phone]  Date:  03/22/2024   Name:  JODENE POLYAK   DOB:  11/28/1960   MRN:  [id number]  PCP:  Watt Harlene BROCKS, MD    Chief Complaint: No chief complaint on file.   History of Present Illness:  AMEERA TIGUE is a 63 y.o. very pleasant female patient who presents with the following:  Pt seen today for a CPE Last seen by myself just over a year ago History of dyslipidemia, hypothyroidism, prediabetes, vitamin D deficiency, cervical radiculopathy   She enjoys walking her dog for exercise Mammo Dexa 2024 Colon 2021 Pap can be updated  Shingrix is complete Can update labs today  Foasamax- not taking Vit D  Vivelle dot synthroid  Patient Active Problem List   Diagnosis Date Noted   Osteoporosis 12/18/2022   Prediabetes 05/24/2022   Dyslipidemia 05/24/2022   Vitamin D deficiency 05/24/2022   Acquired hypothyroidism 09/13/2020   Right leg paresthesias 04/03/2017    Past Medical History:  Diagnosis Date   Acquired hypothyroidism 09/13/2020   Allergy    Osteoporosis 12/18/2022    Past Surgical History:  Procedure Laterality Date   CESAREAN SECTION      Social History   Tobacco Use   Smoking status: Never   Smokeless tobacco: Never  Substance Use Topics   Alcohol use: No    Alcohol/week: 0.0 standard drinks of alcohol   Drug use: No    Family History  Problem Relation Age of Onset   Breast cancer Mother    Cervical cancer Mother    COPD Mother    Hypertension Mother    COPD Father     Allergies  Allergen Reactions   Penicillins Hives    Childhood - ? Hives    Medication list has been reviewed and updated.  Current Outpatient Medications on File Prior to Visit  Medication Sig Dispense Refill   alendronate (FOSAMAX) 70 MG tablet Take 1 tablet by mouth every 7 days. Take with a full glass of water on an empty stomach.  (Patient not taking: Reported on 01/29/2023) 12 tablet 3   Cholecalciferol (VITAMIN D3) 1.25 MG (50000 UT) CAPS Take 1 capsule (50,000 Units total) by mouth once a week for 12 weeks. 12 capsule 90   estradiol (VIVELLE-DOT) 0.0375 MG/24HR Place 1 patch onto the skin 2 times weekly as directed. 24 patch 1   Insulin Syringe-Needle U-100 25G X 1 1 ML MISC Use as directed to inject B-12. 20 each 2   levothyroxine (SYNTHROID) 75 MCG tablet Take 1 tablet (75 mcg total) by mouth daily. 90 tablet 1   meloxicam (MOBIC) 15 MG tablet Take 1 tablet (15 mg total) by mouth daily. 30 tablet 0   methocarbamol (ROBAXIN) 500 MG tablet Take 1 tablet (500 mg total) by mouth every 8 (eight) hours as needed for muscle spasms. 21 tablet 0   No current facility-administered medications on file prior to visit.    Review of Systems:  As per HPI- otherwise negative.   Physical Examination: There were no vitals filed for this visit. There were no vitals filed for this visit. There is no height or weight on file to calculate BMI. Ideal Body Weight:    GEN: no acute distress. HEENT: Atraumatic, Normocephalic.  Ears and Nose: No external deformity. CV: RRR, No M/G/R. No JVD. No thrill. No extra heart sounds. PULM: CTA  B, no wheezes, crackles, rhonchi. No retractions. No resp. distress. No accessory muscle use. ABD: S, NT, ND, +BS. No rebound. No HSM. EXTR: No c/c/e PSYCH: Normally interactive. Conversant.    Assessment and Plan: *** Physical exam- encouraged healthy diet and exercise routine Will plan further follow- up pending labs.  Signed Harlene Schroeder, MD

## 2024-03-22 ENCOUNTER — Encounter: Payer: Self-pay | Admitting: Family Medicine

## 2024-03-22 ENCOUNTER — Other Ambulatory Visit (HOSPITAL_COMMUNITY)
Admission: RE | Admit: 2024-03-22 | Discharge: 2024-03-22 | Disposition: A | Discharge status: Home / Self Care | Source: Ambulatory Visit | Attending: Family Medicine | Admitting: Family Medicine

## 2024-03-22 ENCOUNTER — Ambulatory Visit (INDEPENDENT_AMBULATORY_CARE_PROVIDER_SITE_OTHER): Admitting: Family Medicine

## 2024-03-22 ENCOUNTER — Other Ambulatory Visit (HOSPITAL_BASED_OUTPATIENT_CLINIC_OR_DEPARTMENT_OTHER): Payer: Self-pay

## 2024-03-22 VITALS — BP 104/70 | HR 86 | Temp 97.8°F | Ht 65.0 in | Wt 146.6 lb

## 2024-03-22 DIAGNOSIS — E559 Vitamin D deficiency, unspecified: Secondary | ICD-10-CM

## 2024-03-22 DIAGNOSIS — M810 Age-related osteoporosis without current pathological fracture: Secondary | ICD-10-CM

## 2024-03-22 DIAGNOSIS — E785 Hyperlipidemia, unspecified: Secondary | ICD-10-CM

## 2024-03-22 DIAGNOSIS — E039 Hypothyroidism, unspecified: Secondary | ICD-10-CM

## 2024-03-22 DIAGNOSIS — Z1231 Encounter for screening mammogram for malignant neoplasm of breast: Secondary | ICD-10-CM

## 2024-03-22 DIAGNOSIS — Z124 Encounter for screening for malignant neoplasm of cervix: Secondary | ICD-10-CM | POA: Insufficient documentation

## 2024-03-22 DIAGNOSIS — Z0000 Encounter for general adult medical examination without abnormal findings: Secondary | ICD-10-CM

## 2024-03-22 DIAGNOSIS — R7303 Prediabetes: Secondary | ICD-10-CM

## 2024-03-22 DIAGNOSIS — H60502 Unspecified acute noninfective otitis externa, left ear: Secondary | ICD-10-CM | POA: Diagnosis not present

## 2024-03-22 LAB — LIPID PANEL
Cholesterol: 187 mg/dL (ref 0–200)
HDL: 50.4 mg/dL (ref >39.00)
LDL Cholesterol: 113 mg/dL — ABNORMAL HIGH (ref 0–99)
NonHDL: 136.99
Total CHOL/HDL Ratio: 4
Triglycerides: 119 mg/dL (ref 0.0–149.0)
VLDL: 23.8 mg/dL (ref 0.0–40.0)

## 2024-03-22 LAB — COMPREHENSIVE METABOLIC PANEL WITH GFR
ALT: 12 U/L (ref 0–35)
AST: 16 U/L (ref 0–37)
Albumin: 4.4 g/dL (ref 3.5–5.2)
Alkaline Phosphatase: 47 U/L (ref 39–117)
BUN: 17 mg/dL (ref 6–23)
CO2: 27 meq/L (ref 19–32)
Calcium: 9 mg/dL (ref 8.4–10.5)
Chloride: 105 meq/L (ref 96–112)
Creatinine, Ser: 0.87 mg/dL (ref 0.40–1.20)
GFR: 70.94 mL/min (ref >60.00)
Glucose, Bld: 89 mg/dL (ref 70–99)
Potassium: 4 meq/L (ref 3.5–5.1)
Sodium: 140 meq/L (ref 135–145)
Total Bilirubin: 0.9 mg/dL (ref 0.2–1.2)
Total Protein: 6.7 g/dL (ref 6.0–8.3)

## 2024-03-22 LAB — VITAMIN D 25 HYDROXY (VIT D DEFICIENCY, FRACTURES): VITD: 31.71 ng/mL (ref 30.00–100.00)

## 2024-03-22 LAB — CBC
HCT: 41.3 % (ref 36.0–46.0)
Hemoglobin: 13.9 g/dL (ref 12.0–15.0)
MCHC: 33.7 g/dL (ref 30.0–36.0)
MCV: 89.8 fl (ref 78.0–100.0)
Platelets: 253 K/uL (ref 150.0–400.0)
RBC: 4.6 Mil/uL (ref 3.87–5.11)
RDW: 12.7 % (ref 11.5–15.5)
WBC: 4.8 K/uL (ref 4.0–10.5)

## 2024-03-22 LAB — HEMOGLOBIN A1C: Hgb A1c MFr Bld: 5.5 % (ref 4.6–6.5)

## 2024-03-22 MED ORDER — OFLOXACIN 0.3 % OT SOLN
10.0000 [drp] | Freq: Every day | OTIC | 0 refills | Status: AC
  Filled 2024-03-22: qty 5, 10d supply, fill #0

## 2024-03-22 MED ORDER — ALENDRONATE SODIUM 70 MG PO TABS
70.0000 mg | ORAL_TABLET | ORAL | 3 refills | Status: AC
  Filled 2024-03-22: qty 12, 84d supply, fill #0

## 2024-03-22 MED ORDER — LEVOTHYROXINE SODIUM 75 MCG PO TABS
75.0000 ug | ORAL_TABLET | Freq: Every day | ORAL | 3 refills | Status: AC
  Filled 2024-03-22: qty 90, 90d supply, fill #0

## 2024-03-23 ENCOUNTER — Encounter: Payer: Self-pay | Admitting: Family Medicine

## 2024-03-23 LAB — CYTOLOGY - PAP
Comment: NEGATIVE
Diagnosis: NEGATIVE
High risk HPV: NEGATIVE

## 2024-04-13 ENCOUNTER — Other Ambulatory Visit: Payer: Self-pay

## 2024-04-13 ENCOUNTER — Other Ambulatory Visit (HOSPITAL_BASED_OUTPATIENT_CLINIC_OR_DEPARTMENT_OTHER): Payer: Self-pay

## 2024-04-13 DIAGNOSIS — L821 Other seborrheic keratosis: Secondary | ICD-10-CM | POA: Diagnosis not present

## 2024-04-13 DIAGNOSIS — D2371 Other benign neoplasm of skin of right lower limb, including hip: Secondary | ICD-10-CM | POA: Diagnosis not present

## 2024-04-13 DIAGNOSIS — L570 Actinic keratosis: Secondary | ICD-10-CM | POA: Diagnosis not present

## 2024-04-13 DIAGNOSIS — Z85828 Personal history of other malignant neoplasm of skin: Secondary | ICD-10-CM | POA: Diagnosis not present

## 2024-04-13 DIAGNOSIS — L814 Other melanin hyperpigmentation: Secondary | ICD-10-CM | POA: Diagnosis not present

## 2024-04-13 DIAGNOSIS — D1801 Hemangioma of skin and subcutaneous tissue: Secondary | ICD-10-CM | POA: Diagnosis not present

## 2024-04-13 DIAGNOSIS — D692 Other nonthrombocytopenic purpura: Secondary | ICD-10-CM | POA: Diagnosis not present

## 2024-04-13 MED ORDER — FLUOROURACIL 5 % EX CREA
1.0000 | TOPICAL_CREAM | Freq: Two times a day (BID) | CUTANEOUS | 0 refills | Status: AC
  Filled 2024-04-13: qty 40, 14d supply, fill #0

## 2024-04-27 DIAGNOSIS — H524 Presbyopia: Secondary | ICD-10-CM | POA: Diagnosis not present

## 2024-05-12 ENCOUNTER — Other Ambulatory Visit (HOSPITAL_COMMUNITY): Payer: Self-pay

## 2024-05-12 MED ORDER — FLUZONE 0.5 ML IM SUSY
0.5000 mL | PREFILLED_SYRINGE | Freq: Once | INTRAMUSCULAR | 0 refills | Status: AC
  Filled 2024-05-12: qty 0.5, 1d supply, fill #0

## 2024-06-07 ENCOUNTER — Encounter: Payer: Self-pay | Admitting: Radiology

## 2024-08-11 ENCOUNTER — Other Ambulatory Visit (HOSPITAL_COMMUNITY): Payer: Self-pay
